# Patient Record
Sex: Male | Born: 1960 | Hispanic: Yes | Marital: Married | Smoking: Former smoker
Health system: Southern US, Community
[De-identification: ages and names within clinical notes are randomized; demographics above are authoritative.]

## PROBLEM LIST (undated history)

## (undated) DIAGNOSIS — D179 Benign lipomatous neoplasm, unspecified: Secondary | ICD-10-CM

## (undated) DIAGNOSIS — K8020 Calculus of gallbladder without cholecystitis without obstruction: Secondary | ICD-10-CM

## (undated) DIAGNOSIS — M1990 Unspecified osteoarthritis, unspecified site: Secondary | ICD-10-CM

## (undated) DIAGNOSIS — I10 Essential (primary) hypertension: Secondary | ICD-10-CM

## (undated) HISTORY — PX: OTHER SURGICAL HISTORY: SHX169

---

## 2012-01-16 HISTORY — PX: BACK SURGERY: SHX140

## 2013-01-22 DIAGNOSIS — Z23 Encounter for immunization: Secondary | ICD-10-CM | POA: Diagnosis not present

## 2013-01-22 DIAGNOSIS — Z136 Encounter for screening for cardiovascular disorders: Secondary | ICD-10-CM | POA: Diagnosis not present

## 2013-01-22 DIAGNOSIS — K2970 Gastritis, unspecified, without bleeding: Secondary | ICD-10-CM | POA: Diagnosis not present

## 2013-01-22 DIAGNOSIS — Z125 Encounter for screening for malignant neoplasm of prostate: Secondary | ICD-10-CM | POA: Diagnosis not present

## 2013-01-22 DIAGNOSIS — Z0000 Encounter for general adult medical examination without abnormal findings: Secondary | ICD-10-CM | POA: Diagnosis not present

## 2013-01-22 DIAGNOSIS — K2990 Gastroduodenitis, unspecified, without bleeding: Secondary | ICD-10-CM | POA: Diagnosis not present

## 2013-01-22 DIAGNOSIS — Z1331 Encounter for screening for depression: Secondary | ICD-10-CM | POA: Diagnosis not present

## 2013-01-22 DIAGNOSIS — M519 Unspecified thoracic, thoracolumbar and lumbosacral intervertebral disc disorder: Secondary | ICD-10-CM | POA: Diagnosis not present

## 2013-01-22 DIAGNOSIS — Z131 Encounter for screening for diabetes mellitus: Secondary | ICD-10-CM | POA: Diagnosis not present

## 2013-02-19 DIAGNOSIS — M545 Low back pain, unspecified: Secondary | ICD-10-CM | POA: Diagnosis not present

## 2013-02-19 DIAGNOSIS — M5137 Other intervertebral disc degeneration, lumbosacral region: Secondary | ICD-10-CM | POA: Diagnosis not present

## 2013-02-19 DIAGNOSIS — IMO0002 Reserved for concepts with insufficient information to code with codable children: Secondary | ICD-10-CM | POA: Diagnosis not present

## 2013-02-19 DIAGNOSIS — M47817 Spondylosis without myelopathy or radiculopathy, lumbosacral region: Secondary | ICD-10-CM | POA: Diagnosis not present

## 2013-02-20 ENCOUNTER — Other Ambulatory Visit: Payer: Self-pay | Admitting: Orthopaedic Surgery

## 2013-02-20 DIAGNOSIS — M545 Low back pain, unspecified: Secondary | ICD-10-CM

## 2013-02-26 ENCOUNTER — Ambulatory Visit
Admission: RE | Admit: 2013-02-26 | Discharge: 2013-02-26 | Disposition: A | Discharge status: Home / Self Care | Payer: Medicare Other | Source: Ambulatory Visit | Attending: Orthopaedic Surgery | Admitting: Orthopaedic Surgery

## 2013-02-26 DIAGNOSIS — M545 Low back pain, unspecified: Secondary | ICD-10-CM

## 2013-03-09 DIAGNOSIS — M545 Low back pain, unspecified: Secondary | ICD-10-CM | POA: Diagnosis not present

## 2013-03-09 DIAGNOSIS — M4120 Other idiopathic scoliosis, site unspecified: Secondary | ICD-10-CM | POA: Diagnosis not present

## 2013-03-09 DIAGNOSIS — M47817 Spondylosis without myelopathy or radiculopathy, lumbosacral region: Secondary | ICD-10-CM | POA: Diagnosis not present

## 2013-03-09 DIAGNOSIS — IMO0002 Reserved for concepts with insufficient information to code with codable children: Secondary | ICD-10-CM | POA: Diagnosis not present

## 2013-03-09 DIAGNOSIS — Z87442 Personal history of urinary calculi: Secondary | ICD-10-CM | POA: Diagnosis not present

## 2013-03-09 DIAGNOSIS — M5137 Other intervertebral disc degeneration, lumbosacral region: Secondary | ICD-10-CM | POA: Diagnosis not present

## 2013-03-09 DIAGNOSIS — M4716 Other spondylosis with myelopathy, lumbar region: Secondary | ICD-10-CM | POA: Diagnosis not present

## 2013-03-09 DIAGNOSIS — Z87891 Personal history of nicotine dependence: Secondary | ICD-10-CM | POA: Diagnosis not present

## 2013-03-16 DIAGNOSIS — M4716 Other spondylosis with myelopathy, lumbar region: Secondary | ICD-10-CM | POA: Diagnosis not present

## 2013-03-16 DIAGNOSIS — M4120 Other idiopathic scoliosis, site unspecified: Secondary | ICD-10-CM | POA: Diagnosis present

## 2013-03-16 DIAGNOSIS — Z87442 Personal history of urinary calculi: Secondary | ICD-10-CM | POA: Diagnosis not present

## 2013-03-16 DIAGNOSIS — IMO0002 Reserved for concepts with insufficient information to code with codable children: Secondary | ICD-10-CM | POA: Diagnosis not present

## 2013-03-16 DIAGNOSIS — Z87891 Personal history of nicotine dependence: Secondary | ICD-10-CM | POA: Diagnosis not present

## 2013-03-16 DIAGNOSIS — M48061 Spinal stenosis, lumbar region without neurogenic claudication: Secondary | ICD-10-CM | POA: Diagnosis not present

## 2013-03-16 DIAGNOSIS — M5137 Other intervertebral disc degeneration, lumbosacral region: Secondary | ICD-10-CM | POA: Diagnosis present

## 2013-03-16 DIAGNOSIS — Z981 Arthrodesis status: Secondary | ICD-10-CM | POA: Diagnosis not present

## 2013-04-03 DIAGNOSIS — M48061 Spinal stenosis, lumbar region without neurogenic claudication: Secondary | ICD-10-CM | POA: Diagnosis not present

## 2013-04-03 DIAGNOSIS — M545 Low back pain, unspecified: Secondary | ICD-10-CM | POA: Diagnosis not present

## 2013-04-03 DIAGNOSIS — M4120 Other idiopathic scoliosis, site unspecified: Secondary | ICD-10-CM | POA: Diagnosis not present

## 2013-04-03 DIAGNOSIS — M4716 Other spondylosis with myelopathy, lumbar region: Secondary | ICD-10-CM | POA: Diagnosis not present

## 2013-06-01 DIAGNOSIS — M79609 Pain in unspecified limb: Secondary | ICD-10-CM | POA: Diagnosis not present

## 2013-06-01 DIAGNOSIS — M545 Low back pain, unspecified: Secondary | ICD-10-CM | POA: Diagnosis not present

## 2013-06-25 DIAGNOSIS — M4716 Other spondylosis with myelopathy, lumbar region: Secondary | ICD-10-CM | POA: Diagnosis not present

## 2013-08-27 DIAGNOSIS — IMO0002 Reserved for concepts with insufficient information to code with codable children: Secondary | ICD-10-CM | POA: Diagnosis not present

## 2013-08-27 DIAGNOSIS — M5137 Other intervertebral disc degeneration, lumbosacral region: Secondary | ICD-10-CM | POA: Diagnosis not present

## 2013-08-27 DIAGNOSIS — M545 Low back pain, unspecified: Secondary | ICD-10-CM | POA: Diagnosis not present

## 2013-08-27 DIAGNOSIS — M4716 Other spondylosis with myelopathy, lumbar region: Secondary | ICD-10-CM | POA: Diagnosis not present

## 2013-09-04 DIAGNOSIS — M545 Low back pain, unspecified: Secondary | ICD-10-CM | POA: Diagnosis not present

## 2013-09-04 DIAGNOSIS — IMO0002 Reserved for concepts with insufficient information to code with codable children: Secondary | ICD-10-CM | POA: Diagnosis not present

## 2013-09-04 DIAGNOSIS — M4716 Other spondylosis with myelopathy, lumbar region: Secondary | ICD-10-CM | POA: Diagnosis not present

## 2013-09-09 DIAGNOSIS — M545 Low back pain, unspecified: Secondary | ICD-10-CM | POA: Diagnosis not present

## 2013-09-09 DIAGNOSIS — IMO0002 Reserved for concepts with insufficient information to code with codable children: Secondary | ICD-10-CM | POA: Diagnosis not present

## 2013-09-09 DIAGNOSIS — M4716 Other spondylosis with myelopathy, lumbar region: Secondary | ICD-10-CM | POA: Diagnosis not present

## 2013-10-21 DIAGNOSIS — M4806 Spinal stenosis, lumbar region: Secondary | ICD-10-CM | POA: Diagnosis not present

## 2013-10-21 DIAGNOSIS — M961 Postlaminectomy syndrome, not elsewhere classified: Secondary | ICD-10-CM | POA: Diagnosis not present

## 2013-10-21 DIAGNOSIS — M545 Low back pain: Secondary | ICD-10-CM | POA: Diagnosis not present

## 2013-10-21 DIAGNOSIS — M4716 Other spondylosis with myelopathy, lumbar region: Secondary | ICD-10-CM | POA: Diagnosis not present

## 2013-10-26 ENCOUNTER — Other Ambulatory Visit: Payer: Self-pay | Admitting: Orthopaedic Surgery

## 2013-10-26 DIAGNOSIS — M545 Low back pain: Secondary | ICD-10-CM

## 2013-11-03 ENCOUNTER — Ambulatory Visit
Admission: RE | Admit: 2013-11-03 | Discharge: 2013-11-03 | Disposition: A | Discharge status: Home / Self Care | Payer: Medicare Other | Source: Ambulatory Visit | Attending: Orthopaedic Surgery | Admitting: Orthopaedic Surgery

## 2013-11-03 DIAGNOSIS — M5136 Other intervertebral disc degeneration, lumbar region: Secondary | ICD-10-CM | POA: Diagnosis not present

## 2013-11-03 DIAGNOSIS — M545 Low back pain: Secondary | ICD-10-CM

## 2013-11-03 DIAGNOSIS — M4806 Spinal stenosis, lumbar region: Secondary | ICD-10-CM | POA: Diagnosis not present

## 2013-11-03 DIAGNOSIS — M47816 Spondylosis without myelopathy or radiculopathy, lumbar region: Secondary | ICD-10-CM | POA: Diagnosis not present

## 2013-11-03 MED ORDER — GADOBENATE DIMEGLUMINE 529 MG/ML IV SOLN
20.0000 mL | Freq: Once | INTRAVENOUS | Status: AC | PRN
  Administered 2013-11-03: 20 mL via INTRAVENOUS

## 2013-12-02 DIAGNOSIS — M961 Postlaminectomy syndrome, not elsewhere classified: Secondary | ICD-10-CM | POA: Diagnosis not present

## 2013-12-02 DIAGNOSIS — M545 Low back pain: Secondary | ICD-10-CM | POA: Diagnosis not present

## 2013-12-02 DIAGNOSIS — M4726 Other spondylosis with radiculopathy, lumbar region: Secondary | ICD-10-CM | POA: Diagnosis not present

## 2013-12-21 DIAGNOSIS — M4726 Other spondylosis with radiculopathy, lumbar region: Secondary | ICD-10-CM | POA: Diagnosis not present

## 2013-12-21 DIAGNOSIS — M545 Low back pain: Secondary | ICD-10-CM | POA: Diagnosis not present

## 2013-12-21 DIAGNOSIS — M4716 Other spondylosis with myelopathy, lumbar region: Secondary | ICD-10-CM | POA: Diagnosis not present

## 2013-12-21 DIAGNOSIS — M961 Postlaminectomy syndrome, not elsewhere classified: Secondary | ICD-10-CM | POA: Diagnosis not present

## 2014-01-04 DIAGNOSIS — M4726 Other spondylosis with radiculopathy, lumbar region: Secondary | ICD-10-CM | POA: Diagnosis not present

## 2014-01-04 DIAGNOSIS — M961 Postlaminectomy syndrome, not elsewhere classified: Secondary | ICD-10-CM | POA: Diagnosis not present

## 2014-01-04 DIAGNOSIS — M545 Low back pain: Secondary | ICD-10-CM | POA: Diagnosis not present

## 2014-03-15 DIAGNOSIS — K219 Gastro-esophageal reflux disease without esophagitis: Secondary | ICD-10-CM | POA: Diagnosis not present

## 2014-04-12 DIAGNOSIS — Z87442 Personal history of urinary calculi: Secondary | ICD-10-CM | POA: Diagnosis not present

## 2014-04-12 DIAGNOSIS — Z1389 Encounter for screening for other disorder: Secondary | ICD-10-CM | POA: Diagnosis not present

## 2014-04-12 DIAGNOSIS — E780 Pure hypercholesterolemia: Secondary | ICD-10-CM | POA: Diagnosis not present

## 2014-04-12 DIAGNOSIS — Z0000 Encounter for general adult medical examination without abnormal findings: Secondary | ICD-10-CM | POA: Diagnosis not present

## 2014-04-12 DIAGNOSIS — Z125 Encounter for screening for malignant neoplasm of prostate: Secondary | ICD-10-CM | POA: Diagnosis not present

## 2014-04-19 ENCOUNTER — Encounter (HOSPITAL_COMMUNITY): Payer: Self-pay | Admitting: Emergency Medicine

## 2014-04-19 ENCOUNTER — Emergency Department (HOSPITAL_COMMUNITY)
Admission: EM | Admit: 2014-04-19 | Discharge: 2014-04-20 | Disposition: A | Discharge status: Home / Self Care | Payer: Medicare Other | Attending: Emergency Medicine | Admitting: Emergency Medicine

## 2014-04-19 DIAGNOSIS — D1739 Benign lipomatous neoplasm of skin and subcutaneous tissue of other sites: Secondary | ICD-10-CM | POA: Diagnosis not present

## 2014-04-19 DIAGNOSIS — R2242 Localized swelling, mass and lump, left lower limb: Secondary | ICD-10-CM | POA: Insufficient documentation

## 2014-04-19 DIAGNOSIS — Z87891 Personal history of nicotine dependence: Secondary | ICD-10-CM | POA: Diagnosis not present

## 2014-04-19 DIAGNOSIS — Z79899 Other long term (current) drug therapy: Secondary | ICD-10-CM | POA: Diagnosis not present

## 2014-04-19 DIAGNOSIS — R1031 Right lower quadrant pain: Secondary | ICD-10-CM | POA: Diagnosis present

## 2014-04-19 DIAGNOSIS — N201 Calculus of ureter: Secondary | ICD-10-CM | POA: Diagnosis not present

## 2014-04-19 LAB — I-STAT CHEM 8, ED
BUN: 16 mg/dL (ref 6–23)
CREATININE: 1.1 mg/dL (ref 0.50–1.35)
Calcium, Ion: 1.31 mmol/L — ABNORMAL HIGH (ref 1.12–1.23)
Chloride: 103 mmol/L (ref 96–112)
Glucose, Bld: 163 mg/dL — ABNORMAL HIGH (ref 70–99)
HEMATOCRIT: 48 % (ref 39.0–52.0)
HEMOGLOBIN: 16.3 g/dL (ref 13.0–17.0)
POTASSIUM: 3.5 mmol/L (ref 3.5–5.1)
Sodium: 139 mmol/L (ref 135–145)
TCO2: 21 mmol/L (ref 0–100)

## 2014-04-19 LAB — CBC WITH DIFFERENTIAL/PLATELET
BASOS ABS: 0.1 10*3/uL (ref 0.0–0.1)
BASOS PCT: 1 % (ref 0–1)
EOS ABS: 0 10*3/uL (ref 0.0–0.7)
Eosinophils Relative: 0 % (ref 0–5)
HCT: 44 % (ref 39.0–52.0)
Hemoglobin: 15.1 g/dL (ref 13.0–17.0)
Lymphocytes Relative: 6 % — ABNORMAL LOW (ref 12–46)
Lymphs Abs: 0.6 10*3/uL — ABNORMAL LOW (ref 0.7–4.0)
MCH: 30.9 pg (ref 26.0–34.0)
MCHC: 34.3 g/dL (ref 30.0–36.0)
MCV: 90.2 fL (ref 78.0–100.0)
Monocytes Absolute: 0.6 10*3/uL (ref 0.1–1.0)
Monocytes Relative: 6 % (ref 3–12)
NEUTROS PCT: 87 % — AB (ref 43–77)
Neutro Abs: 8.1 10*3/uL — ABNORMAL HIGH (ref 1.7–7.7)
PLATELETS: 333 10*3/uL (ref 150–400)
RBC: 4.88 MIL/uL (ref 4.22–5.81)
RDW: 12.5 % (ref 11.5–15.5)
WBC: 9.3 10*3/uL (ref 4.0–10.5)

## 2014-04-19 LAB — URINALYSIS, ROUTINE W REFLEX MICROSCOPIC
Bilirubin Urine: NEGATIVE
Glucose, UA: NEGATIVE mg/dL
KETONES UR: 15 mg/dL — AB
LEUKOCYTES UA: NEGATIVE
NITRITE: NEGATIVE
PROTEIN: NEGATIVE mg/dL
Specific Gravity, Urine: 1.018 (ref 1.005–1.030)
Urobilinogen, UA: 0.2 mg/dL (ref 0.0–1.0)
pH: 6 (ref 5.0–8.0)

## 2014-04-19 LAB — URINE MICROSCOPIC-ADD ON

## 2014-04-19 MED ORDER — SODIUM CHLORIDE 0.9 % IV BOLUS (SEPSIS)
1000.0000 mL | Freq: Once | INTRAVENOUS | Status: AC
  Administered 2014-04-19: 1000 mL via INTRAVENOUS

## 2014-04-19 MED ORDER — KETOROLAC TROMETHAMINE 15 MG/ML IJ SOLN
15.0000 mg | Freq: Once | INTRAMUSCULAR | Status: AC
  Administered 2014-04-19: 15 mg via INTRAVENOUS
  Filled 2014-04-19: qty 1

## 2014-04-19 MED ORDER — ONDANSETRON HCL 4 MG/2ML IJ SOLN
4.0000 mg | Freq: Once | INTRAMUSCULAR | Status: AC
  Administered 2014-04-19: 4 mg via INTRAVENOUS
  Filled 2014-04-19: qty 2

## 2014-04-19 NOTE — ED Provider Notes (Signed)
CSN: [id number]     Arrival date & time 04/19/14  1954 History   First MD Initiated Contact with Patient 04/19/14 2231     Chief Complaint  Patient presents with  . Flank Pain     Patient is a 54 y.o. male presenting with flank pain. The history is provided by the patient. No language interpreter was used.  Flank Pain   Mr. Sheek here for evaluation of flank pain. He reports that he's had right lower quadrant pain radiating to the back and her medically for the last 3 weeks. He saw his PCP last week and had a urine test for blood in his urine. His symptoms became worse today and now they are constant. He reports hematuria today with nausea and vomiting. The vomiting is secondary to pain. He denies any fevers, cough, shortness of breath.  He has a history of similar symptoms several years ago and was diagnosed with kidney stones. Symptoms are moderate, constant, worsening.  History reviewed. No pertinent past medical history. Past Surgical History  Procedure Laterality Date  . Back surgery     No family history on file. History  Substance Use Topics  . Smoking status: Former Research scientist (life sciences)  . Smokeless tobacco: Not on file  . Alcohol Use: Yes    Review of Systems  Genitourinary: Positive for flank pain.  All other systems reviewed and are negative.     Allergies  Review of patient's allergies indicates not on file.  Home Medications   Prior to Admission medications   Medication Sig Start Date End Date Taking? Authorizing Provider  gabapentin (NEURONTIN) 300 MG capsule Take 300 mg by mouth 3 (three) times daily.   Yes Historical Provider, MD   BP 156/93 mmHg  Pulse 66  Temp(Src) 99 F (37.2 C) (Oral)  Resp 15  Ht 6\' 1"  (1.854 m)  Wt 217 lb (98.431 kg)  BMI 28.64 kg/m2  SpO2 97% Physical Exam  Constitutional: He is oriented to person, place, and time. He appears well-developed and well-nourished.  HENT:  Head: Normocephalic and atraumatic.  Cardiovascular: Normal rate  and regular rhythm.   No murmur heard. Pulmonary/Chest: Effort normal and breath sounds normal. No respiratory distress.  Abdominal: Soft. There is no rebound and no guarding.  Mild right lower quadrant tenderness without any guarding or rebound  Musculoskeletal: He exhibits no edema or tenderness.  Neurological: He is alert and oriented to person, place, and time.  Skin: Skin is warm and dry.  Psychiatric: He has a normal mood and affect. His behavior is normal.  Nursing note and vitals reviewed.   ED Course  Procedures (including critical care time) Labs Review Labs Reviewed  URINALYSIS, ROUTINE W REFLEX MICROSCOPIC - Abnormal; Notable for the following:    Hgb urine dipstick MODERATE (*)    Ketones, ur 15 (*)    All other components within normal limits  CBC WITH DIFFERENTIAL/PLATELET - Abnormal; Notable for the following:    Neutrophils Relative % 87 (*)    Neutro Abs 8.1 (*)    Lymphocytes Relative 6 (*)    Lymphs Abs 0.6 (*)    All other components within normal limits  I-STAT CHEM 8, ED - Abnormal; Notable for the following:    Glucose, Bld 163 (*)    Calcium, Ion 1.31 (*)    All other components within normal limits  URINE MICROSCOPIC-ADD ON    Imaging Review No results found.   EKG Interpretation None      MDM  Final diagnoses:  Ureterolithiasis  Mass of left hip region    Patient here for evaluation of right-sided flank pain that is intermittent in nature with hematuria. History and presentation concerning for renal colic. CT renal study pending. Patient care transferred to Dr. Roxanne Mins pending CT abdomen. Patient's pain resolved after Toradol administration.    Quintella Reichert, MD 04/21/14 [phone]

## 2014-04-19 NOTE — ED Notes (Signed)
Pt c/o right lower abd pain and right flank pain, onset this am.  Also c/o hematuria and nausea.

## 2014-04-19 NOTE — ED Notes (Signed)
Called CT for eta, patient is 6th in line. Informed patient delay.

## 2014-04-20 ENCOUNTER — Emergency Department (HOSPITAL_COMMUNITY): Payer: Medicare Other

## 2014-04-20 ENCOUNTER — Encounter (HOSPITAL_COMMUNITY): Payer: Self-pay

## 2014-04-20 DIAGNOSIS — N201 Calculus of ureter: Secondary | ICD-10-CM | POA: Diagnosis not present

## 2014-04-20 DIAGNOSIS — D1739 Benign lipomatous neoplasm of skin and subcutaneous tissue of other sites: Secondary | ICD-10-CM | POA: Diagnosis not present

## 2014-04-20 MED ORDER — TAMSULOSIN HCL 0.4 MG PO CAPS: 0.4000 mg | ORAL_CAPSULE | Freq: Every day | ORAL | Status: DC

## 2014-04-20 MED ORDER — OXYCODONE-ACETAMINOPHEN 5-325 MG PO TABS: 1.0000 | ORAL_TABLET | ORAL | Status: DC | PRN

## 2014-04-20 NOTE — Discharge Instructions (Signed)
Take ibuprofen or naproxen as needed.  You have a mass near your left hip - see the orthopedic doctor to evaluate it.  Clculos renales (Kidney Stones) Los clculos renales (urolitiasis) son masas slidas que se forman en el interior de los riones. El dolor intenso es causado por el movimiento de la piedra a travs del tracto urinario. Cuando la piedra se mueve, el urter hace un espasmo alrededor de la misma. El clculo generalmente se elimina con la [place].  CAUSAS   Un trastorno que hace que ciertas glndulas del cuello produzcan demasiada hormona paratiroidea (hiperparatiroidismo primario).  Una acumulacin de cristales de cido rico, similar a Psychiatric nurse.  Estrechamiento (constriccin) del urter.  Obstruccin en el rin presente al nacer (obstruccin congnita).  Cirugas previas del rin o los urteres.  Numerosas infecciones renales. SNTOMAS   Ganas de vomitar (nuseas).  Devolver la comida (vomitar).  Sangre en la orina (hematuria).  Dolor que generalmente se expande (irradia) hacia la ingle.  Ganas de orinar con frecuencia o de manera urgente. DIAGNSTICO   Historia clnica y examen fsico.  Anlisis de sangre y [place].  Tomografa computada.  En algunos casos se realiza un examen del interior de la vejiga (citoscopa). TRATAMIENTO   Observacin.  Aumentar la ingesta de lquidos.  Litotricia extracorprea con ondas de choque: es un procedimiento no invasivo que [place] ondas de choque para romper los clculos renales.  Ser necesaria la ciruga si tiene dolor muy intenso o la obstruccin persiste. Hay varios procedimientos quirrgicos. La mayora de los procedimientos se realizan con el uso de pequeos instrumentos. Slo es Chartered loss adjuster pequeas incisiones para acomodar estos instrumentos, por lo tanto el tiempo de recuperacin es mnimo. El tamao, la ubicacin y la composicin qumica de los clculos son variables importantes  que determinarn la eleccin correcta de tratamiento para su caso. Comunquese con su mdico para comprender mejor su situacin, de modo que pueda [employer] de lesiones para usted y su rin.  INSTRUCCIONES PARA EL CUIDADO EN EL HOGAR   Beba gran cantidad de lquido para mantener la orina de tono claro o color amarillo plido. Esto ayudar a eliminar las piedras o los fragmentos.  Cuele la orina con el colador que le han provisto. Guarde todas las partculas y piedras para que las vea el profesional que lo asiste. Puede ser tan pequea como un grano de sal. Es muy importante usar el colador cada vez que orine. La recoleccin de piedras permitir al Medtronic y Musician que efectivamente ha eliminado una piedra. El anlisis de la piedra con frecuencia permitir identificar qu puede hacer para reducir la incidencia de las recurrencias.  Slo tome medicamentos de venta libre o recetados para Glass blower/designer, Health and safety inspector o bajar la fiebre, segn las indicaciones de su mdico.  Cumpla con las citas de seguimiento tal como le indic el profesional que lo asiste.  Si se lo indica, hgase radiografas. La ausencia de dolor no siempre significa que las piedras se han eliminado. Puede ser que simplemente hayan dejado de moverse. Si el paso de orina permanece completamente obstruido, puede causar prdida de la funcin renal o simplemente la destruccin del rin. Es su responsabilidad Artist seguimiento y las radiografas. Las ecografas del rin pueden mostrar una obstruccin y el estado del rin. Las ecografas no se asocian con la radiacin y pueden realizarse fcilmente en cuestin de minutos. SOLICITE ATENCIN MDICA SI:  Siente dolor que no responde a los [employer]  recetaron. SOLICITE ATENCIN MDICA DE INMEDIATO SI:   No puede controlar el dolor con los medicamentos que le han recetado.  Siente escalofros o fiebre.  La gravedad o la intensidad del dolor aumenta  durante 18 horas y no se Engineer, production con los analgsicos.  Presenta un nuevo episodio de dolor abdominal.  Sufre mareos o se desmaya.  No puede orinar. ASEGRESE DE QUE:   Comprende estas instrucciones.  Controlar su afeccin.  Recibir ayuda de inmediato si no mejora o si empeora. Document Released: 01/01/2005 Document Revised: 09/03/2012 [employer] Patient Information 2015 [place]. This information is not intended to replace advice given to you by your health care provider. Make sure you discuss any questions you have with your health care provider.  Acetaminophen; Oxycodone tablets Qu es este medicamento? El compuesto ACETAMINOFENO; OXICODONA es un analgsico. Se utiliza para tratar los dolores leves a moderados. Este medicamento puede ser utilizado para otros usos; si tiene alguna pregunta consulte con su proveedor de atencin mdica o con su farmacutico. MARCAS COMERCIALES DISPONIBLES: Endocet, Magnacet, Narvox, Percocet, Perloxx, Primalev, Primlev, Roxicet, Xolox [employer] debo informar a mi profesional de la salud antes de tomar este medicamento? Necesita saber si usted presenta alguno de los [employer] o situaciones: -tumor cerebral -enfermedad de Crohn, enfermedad intestinal inflamatoria o colitis ulcerativa -abuso de drogas o drogadiccin -lesin de la cabeza -problemas cardiacos o circulatorios -si consume alcohol con frecuencia -enfermedad renal o problemas al orinar -enfermedad heptica -enfermedad pulmonar, asma o dificultades al respirar -una reaccin alrgica o inusual al acetaminofeno, a la oxicodona, a otros analgsicos opiceos, a otros medicamentos, alimentos, colorantes o conservantes -si est embarazada o buscando quedar embarazada -si est amamantando a un beb Cmo debo utilizar este medicamento? Tome este medicamento por va oral con un vaso lleno de agua. Siga las instrucciones de la etiqueta del [place]. Tome sus dosis a intervalos  regulares. No tome su medicamento con una frecuencia mayor que la indicada. Hable con su pediatra para informarse acerca del uso de este medicamento en nios. Puede requerir Sales executive. Los pacientes de ms de 65 aos de edad pueden presentar reacciones ms fuertes a Fish farm manager y Designer, industrial/product dosis menores. Sobredosis: Pngase en contacto inmediatamente con un centro toxicolgico o una sala de urgencia si usted cree que haya tomado demasiado medicamento. ATENCIN: [employer] es solo para usted. No comparta este medicamento con nadie. Qu sucede si me olvido de una dosis? Si olvida una dosis, tmela lo antes posible. Si es casi la hora de la prxima dosis, tome slo esa dosis. No tome dosis adicionales o dobles. Qu puede interactuar con este medicamento? -alcohol -antihistamnicos -barbitricos tales como el amobarbital, butalbital, butabarbital, metohexital, pentobarbital, fenobarbital, tiopental y secobarbital -benztropina -medicamentos para problemas de vejiga, tales como solifenacina, trospium, oxibutinina, tolterodina, hiosciamina y metscopolamina -medicamentos para problemas respiratorios, tales como ipratropio y tiotropio -medicamentos para ciertos problemas estomacales o intestinales, tales como propantelina, homatropina metilbromuro, Sports administrator, atropina, belladona y diciclomina -anestsicos generales, tales como etomidato, [place], xido nitroso, propofol, desflurano, enflurano, halotano, isoflurano y sevoflurano -medicamentos para la depresin, ansiedad o trastornos psicticos -medicamentos para dormir -relajantes musculares -naltrexona -medicamentos narcticos (opiceos) para Conservation officer, historic buildings -fenotiazinas, tales como perfenacina, tioridazina, clorpromacina, mesoridazina, flufenazina, proclorperazina, promazina y trifluoperazina -escopolamina -tramadol -trihexifenidilo Puede ser que esta lista no menciona todas las posibles interacciones. Informe a su profesional  de [employer]	[place] de [employer] productos a base de hierbas, medicamentos de [place] o suplementos nutritivos que est tomando. Si usted fuma, consume bebidas alcohlicas o  si utiliza drogas ilegales, indqueselo tambin a su profesional de [employer]	[place]. Algunas sustancias pueden interactuar con su medicamento. A qu debo estar atento al usar Coca-Cola? Si el dolor no desaparece, si empeora o si experimenta un dolor nuevo o de tipo diferente, consulte a su mdico o a su profesional de [employer]	[place]. Usted puede desarrollar tolerancia al medicamento. La tolerancia significa que necesitar una dosis ms alta para Best boy. Tolerancia es normal y esperada cuando est tomando este medicamento por un largo perodo de [place]. No suspenda el uso de su medicamento repentinamente debido a que puede Engineer, materials reaccin severa. Su cuerpo se acostumbra a Fish farm manager. Esto NO significa que sea adicto. La adiccin es un comportamiento que hace referencia a la obtencin y utilizacin de un medicamento con fines que no son mdicos. Si tiene Social research officer, government, existe una razn mdica para que usted tome un analgsico. Su mdico le indicar la cantidad de medicamento que Tree surgeon. Si su mdico desea que [employer], la dosis ser reducida gradualmente para Research officer, political party secundarios. Puede experimentar somnolencia o mareos. No conduzca ni utilice maquinaria ni haga nada que Associate Professor en estado de alerta hasta que sepa cmo le afecta este medicamento. No se siente ni se ponga de pie con rapidez, especialmente si es un paciente de edad avanzada. Esto reduce el riesgo de mareos o [employer]. El alcohol puede interferir con el efecto de este medicamento. Evite consumir bebidas alcohlicas. Hay distintos tipos de medicamentos narcticos (opiceos) para Conservation officer, historic buildings. Si usted toma ms que un tipo a la [employer], podr tener ms [employer]. Dar a su proveedor de atencin medica una lista de todos los  medicamentos que usted [place]. Su mdico le informar la cantidad de medicamento que Aeronautical engineer. No tome ms medicamento que lo indicado. Comunquese con emergencia para ayuda si tiene problemas para respirar. Este medicamento causar estreimiento. Trate de evacuar los intestinos al menos cada 2  3 das. Si no evacua los intestinos durante 3 das, comunquese con su mdico o con su profesional de [employer]	[place]. No tome Tylenol (acetaminofeno) u otros medicamentos que contienen acetaminofeno con este medicamento. Tomando mucho acetaminofeno puede ser muy peligroso. Muchos medicamentos de venta libre contienen acetaminofeno. Lea siempre las etiquetas cuidadosamente para evitar el tomar ms acetaminofeno. Qu efectos secundarios puedo tener al [employer] este medicamento? Efectos secundarios que debe informar a su mdico o a Barrister's clerk de la salud tan pronto como sea posible: -Scientist, clinical (histocompatibility and immunogenetics), tales como erupcin cutnea, picazn o urticarias, hinchazn de la cara, labios o lengua -dificultades respiratorias, sibilancias -confusin -sensacin de desmayos o aturdimiento -dolor de estmago severo -cansancio o debilidad inusual -color amarillento de los ojos o la piel Efectos secundarios que, por lo general, no requieren atencin mdica (debe informarlos a su mdico o a su profesional de la salud si persisten o si son molestos): -mareos -somnolencia -nuseas -vmitos Puede ser que esta lista no menciona todos los posibles efectos secundarios. Comunquese a su mdico por asesoramiento mdico [employer]. Usted puede informar los efectos secundarios a la FDA por telfono al 1-800-FDA-1088. Dnde debo guardar mi medicina? Mantngala fuera del alcance de los nios. Este medicamento puede ser abusado. Mantenga su medicamento en un lugar seguro para protegerlo contra robos. No comparta este medicamento con nadie. Es peligroso vender o ceder este medicamento y est prohibido por la  ley. Gurdelo a [employer], entre 20 y 64 grados C (41 y 65 grados F). Mantenga el envase  bien cerrado. Protjalo de Naval architect. Este medicamento puede causar muerte y sobredosis accidental si es tomado por otros adultos, nios o Copy. Tire los medicamentos que no haya utilizado al inodoro para reducir la posibilidad de dao. No use el medicamento despus de la fecha de vencimiento. ATENCIN: Este folleto es un resumen. Puede ser que no cubra toda la posible informacin. Si usted tiene preguntas acerca de esta medicina, consulte con su mdico, su farmacutico o su profesional de Technical sales engineer.  2015, Elsevier/Gold Standard. (2012-09-15 19:44:45)  Tamsulosin capsules Qu es este medicamento? La TAMSULOSINA se [place] para tratar el agrandamiento de la glndula prosttica en los hombres llamado hiperplasia prosttica benigna o HPB. No se debe utilizar para tratar mujeres. Este medicamento acta como relajante de los msculos de la prstata y del cuello vesical. Esto mejora el paso de la orina y reduce los sntomas de HPB. Este medicamento puede ser utilizado para otros usos; si tiene alguna pregunta consulte con su proveedor de atencin mdica o con su farmacutico. MARCAS COMERCIALES DISPONIBLES: Flomax Qu le debo informar a mi profesional de la salud antes de tomar este medicamento? Necesita saber si usted presenta alguno de los siguientes problemas o situaciones: -enfermedad renal avanzada -enfermedad heptica avanzada -baja presin sangunea -cncer de prstata -una reaccin alrgica o inusual a la tamsulosina, sulfonamidas, a otros medicamentos, alimentos, colorantes o conservantes -si est embarazada o buscando quedar embarazada -si est amamantando a un beb Cmo debo utilizar este medicamento? Tome este medicamento por va oral aproximadamente 30 minutos despus de la [employer]. Siga las instrucciones de la etiqueta del [place]. Tome las cpsulas enteras con un  vaso de agua. No triture, mastique ni abre las cpsulas. No tome su medicamento con una frecuencia mayor a la indicada. No deje de tomarlo excepto si as lo indica su mdico. Hable con su pediatra para informarse acerca del uso de este medicamento en nios. Puede requerir atencin especial. Sobredosis: Pngase en contacto inmediatamente con un centro toxicolgico o una sala de urgencia si usted cree que haya tomado demasiado medicamento. ATENCIN: [employer] es solo para usted. No comparta este medicamento con nadie. Qu sucede si me olvido de una dosis? Si olvida una dosis, tmela tan pronto como sea posible. Si es casi la hora de la prxima dosis, tome slo esa dosis. No tome dosis adicionales o dobles. Si deja de tomar su [place] o ms, pregunte a su mdico o su profesional de la salud la dosis que debe volver a Medical laboratory scientific officer a Radio producer. Qu puede interactuar con este medicamento? -cimetidina -fluoxetina -quetoconazol -medicamentos para disfuncin erctil como, sildenafil, tadalafil, vardenafil -medicamentos para la alta presin sangunea -otros alfa-bloqueadores como, alfuzosn, doxazosn, fentolamina, fenoxibenzamina, prazosn, terazosn -warfarina Puede ser que esta lista no menciona todas las posibles interacciones. Informe a su profesional de [employer]	[place] de [employer] productos a base de hierbas, medicamentos de [place] o suplementos nutritivos que est tomando. Si usted fuma, consume bebidas alcohlicas o si utiliza drogas ilegales, indqueselo tambin a su profesional de [employer]	[place]. Algunas sustancias pueden interactuar con su medicamento. A qu debo estar atento al usar Coca-Cola? Visite a su mdico o a su profesional de la salud para chequear su evolucin peridicamente. Necesitar realizarse [employer] de laboratorio antes de Geophysicist/field seismologist tratamiento con este medicamento y regularmente mientras toma este medicamento. Controle su presin sangunea como le haya  indicado. Pregunte a su profesional de la salud cules deben ser su presin sangunea y cundo deber  comunicarse con l o ella. Este medicamento puede hacerle sentir mareado o aturdido. Es muy probable que experimente esto despus de la primera dosis, despus de un incremento de la dosis o cuando haga calor o realice ejercicios fsicos. El tomar alcohol o algunos medicamentos pueden aumentar este efecto. No conduzca ni utilice maquinaria, ni haga nada que Associate Professor en estado de alerta hasta que sepa cmo le afecta este medicamento. No se siente ni se ponga de pie con rapidez, especialmente si es un paciente de edad avanzada. Si comienza a sentirse mareado, sintese hasta que se sienta mejor. Estos efectos secundarios pueden disminuir una vez que su cuerpo se acostumbre a Fish farm manager. Aunque es muy raro que ocurra en hombres que toman este medicamento, informe inmediatamente a su mdico si experimenta una ereccin dolorosa o prolongada que no est relacionada con actividad sexual. Si no recibe atencin mdica inmediata, est condicin puede conducir a Systems developer. Si va a someterse a una ciruga de cataratas, informe a su cirujano oftalmlogo que est tomando Coca-Cola. Qu efectos secundarios puedo tener al [employer] este medicamento? Efectos secundarios que debe informar a su mdico o a Barrister's clerk de la salud tan pronto como sea posible: -Chief of Staff como erupcin cutnea, picazn o urticarias, hinchazn de los labios, boca, lengua o garganta -problemas respiratorios -cambios en la visin -sensacin de desmayos o aturdimiento -pulso cardiaco irregular -debilidad Efectos secundarios que, por lo general, no requieren atencin mdica (debe informarlos a su mdico o a su profesional de la salud si persisten o si son molestos): -dolor de espalda -cambios en el deseo sexual o capacidad -estreimiento, nusea o vmito -tos -somnolencia -goteo de  la Lawyer o nariz tapada -dificultad para conciliar el sueo Puede ser que esta lista no menciona todos los posibles efectos secundarios. Comunquese a su mdico por asesoramiento mdico [employer]. Usted puede informar los efectos secundarios a la FDA por telfono al 1-800-FDA-1088. Dnde debo guardar mi medicina? Mantngala fuera del alcance de los nios. Gurdela a [employer], entre 15 y 60 grados C (22 y 33 grados F). Deseche los medicamentos que no haya utilizado, despus de la fecha de vencimiento. ATENCIN: Este folleto es un resumen. Puede ser que no cubra toda la posible informacin. Si usted tiene preguntas acerca de esta medicina, consulte con su mdico, su farmacutico o su profesional de Technical sales engineer.  2015, Elsevier/Gold Standard. (2005-11-27 13:43:00)

## 2014-04-20 NOTE — ED Provider Notes (Signed)
Patient initially seen and evaluated by Dr. Ralene Bathe with right flank pain suspicious for kidney stone. CT confirms 2 mm distal right ureteral calculus. Incidental finding of mass near the left hip which may be an atypical lipoma. Currently, patient has no left hip symptoms. He is discharged with prescriptions for tamsulosin and oxycodone-acetaminophen. He is referred to with PDX regarding the master the hip, and urology.  Results for orders placed or performed during the hospital encounter of 04/19/14  Urinalysis, Routine w reflex microscopic  Result Value Ref Range   Color, Urine YELLOW YELLOW   APPearance CLEAR CLEAR   Specific Gravity, Urine 1.018 1.005 - 1.030   pH 6.0 5.0 - 8.0   Glucose, UA NEGATIVE NEGATIVE mg/dL   Hgb urine dipstick MODERATE (A) NEGATIVE   Bilirubin Urine NEGATIVE NEGATIVE   Ketones, ur 15 (A) NEGATIVE mg/dL   Protein, ur NEGATIVE NEGATIVE mg/dL   Urobilinogen, UA 0.2 0.0 - 1.0 mg/dL   Nitrite NEGATIVE NEGATIVE   Leukocytes, UA NEGATIVE NEGATIVE  CBC with Differential  Result Value Ref Range   WBC 9.3 4.0 - 10.5 K/uL   RBC 4.88 4.22 - 5.81 MIL/uL   Hemoglobin 15.1 13.0 - 17.0 g/dL   HCT 44.0 39.0 - 52.0 %   MCV 90.2 78.0 - 100.0 fL   MCH 30.9 26.0 - 34.0 pg   MCHC 34.3 30.0 - 36.0 g/dL   RDW 12.5 11.5 - 15.5 %   Platelets 333 150 - 400 K/uL   Neutrophils Relative % 87 (H) 43 - 77 %   Neutro Abs 8.1 (H) 1.7 - 7.7 K/uL   Lymphocytes Relative 6 (L) 12 - 46 %   Lymphs Abs 0.6 (L) 0.7 - 4.0 K/uL   Monocytes Relative 6 3 - 12 %   Monocytes Absolute 0.6 0.1 - 1.0 K/uL   Eosinophils Relative 0 0 - 5 %   Eosinophils Absolute 0.0 0.0 - 0.7 K/uL   Basophils Relative 1 0 - 1 %   Basophils Absolute 0.1 0.0 - 0.1 K/uL  Urine microscopic-add on  Result Value Ref Range   Squamous Epithelial / LPF RARE RARE   WBC, UA 0-2 <3 WBC/hpf   RBC / HPF 7-10 <3 RBC/hpf  I-Stat Chem 8, ED  Result Value Ref Range   Sodium 139 135 - 145 mmol/L   Potassium 3.5 3.5 - 5.1 mmol/L   Chloride 103 96 - 112 mmol/L   BUN 16 6 - 23 mg/dL   Creatinine, Ser 1.10 0.50 - 1.35 mg/dL   Glucose, Bld 163 (H) 70 - 99 mg/dL   Calcium, Ion 1.31 (H) 1.12 - 1.23 mmol/L   TCO2 21 0 - 100 mmol/L   Hemoglobin 16.3 13.0 - 17.0 g/dL   HCT 48.0 39.0 - 52.0 %   Ct Renal Stone Study  04/20/2014   CLINICAL DATA:  Right groin pain starting Monday morning. Gross hematuria.  EXAM: CT ABDOMEN AND PELVIS WITHOUT CONTRAST  TECHNIQUE: Multidetector CT imaging of the abdomen and pelvis was performed following the standard protocol without IV contrast.  COMPARISON:  None.  FINDINGS: BODY WALL: No contributory findings.  LOWER CHEST: No contributory findings.  ABDOMEN/PELVIS:  Liver: No focal abnormality.  Biliary: No evidence of biliary obstruction or stone.  Pancreas: Unremarkable.  Spleen: Unremarkable.  Adrenals: Unremarkable.  Kidneys and ureters: 2 mm stone at the right ureteral vesicular junction with moderate hydroureteronephrosis and asymmetric perinephric edema. No left hydronephrosis or urolithiasis.  Bladder: Unremarkable.  Reproductive: No pathologic findings.  Bowel: No obstruction.  Retroperitoneum: No mass or adenopathy.  Peritoneum: No ascites or pneumoperitoneum.  Vascular: No acute abnormality.  OSSEOUS: 6 x 4 x 3 cm fatty lobulated mass between the left upper rectus femoris and vastus with internal septation and minimal calcification. There is a simple appearing 18 mm lipoma within the right gluteus maximus.  IMPRESSION: 1. Obstructing 2 mm stone at the right UVJ. 2. 6 cm deep fatty mass anterior to the left hip, as above. 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 Orthopedic referral is recommended. 3. 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   Electronically Signed   By: Monte Fantasia M.D.   On: 04/20/2014 01:35   Images viewed by me.   Delora Fuel, MD 91/[id number]

## 2014-04-27 DIAGNOSIS — M25552 Pain in left hip: Secondary | ICD-10-CM | POA: Diagnosis not present

## 2014-04-28 ENCOUNTER — Other Ambulatory Visit: Payer: Self-pay | Admitting: Physician Assistant

## 2014-04-28 DIAGNOSIS — M25552 Pain in left hip: Secondary | ICD-10-CM

## 2014-05-04 DIAGNOSIS — Z6828 Body mass index (BMI) 28.0-28.9, adult: Secondary | ICD-10-CM | POA: Diagnosis not present

## 2014-05-04 DIAGNOSIS — M961 Postlaminectomy syndrome, not elsewhere classified: Secondary | ICD-10-CM | POA: Diagnosis not present

## 2014-05-04 DIAGNOSIS — M545 Low back pain: Secondary | ICD-10-CM | POA: Diagnosis not present

## 2014-05-04 DIAGNOSIS — M4726 Other spondylosis with radiculopathy, lumbar region: Secondary | ICD-10-CM | POA: Diagnosis not present

## 2014-05-12 ENCOUNTER — Ambulatory Visit
Admission: RE | Admit: 2014-05-12 | Discharge: 2014-05-12 | Disposition: A | Discharge status: Home / Self Care | Payer: Medicare Other | Source: Ambulatory Visit | Attending: Physician Assistant | Admitting: Physician Assistant

## 2014-05-12 DIAGNOSIS — M7601 Gluteal tendinitis, right hip: Secondary | ICD-10-CM | POA: Diagnosis not present

## 2014-05-12 DIAGNOSIS — M25552 Pain in left hip: Secondary | ICD-10-CM

## 2014-05-12 DIAGNOSIS — D1724 Benign lipomatous neoplasm of skin and subcutaneous tissue of left leg: Secondary | ICD-10-CM | POA: Diagnosis not present

## 2014-05-12 MED ORDER — GADOBENATE DIMEGLUMINE 529 MG/ML IV SOLN
20.0000 mL | Freq: Once | INTRAVENOUS | Status: AC | PRN
  Administered 2014-05-12: 20 mL via INTRAVENOUS

## 2014-06-02 DIAGNOSIS — L237 Allergic contact dermatitis due to plants, except food: Secondary | ICD-10-CM | POA: Diagnosis not present

## 2014-08-06 DIAGNOSIS — D492 Neoplasm of unspecified behavior of bone, soft tissue, and skin: Secondary | ICD-10-CM | POA: Diagnosis not present

## 2014-08-06 DIAGNOSIS — C495 Malignant neoplasm of connective and soft tissue of pelvis: Secondary | ICD-10-CM | POA: Diagnosis not present

## 2014-08-11 DIAGNOSIS — M545 Low back pain: Secondary | ICD-10-CM | POA: Diagnosis not present

## 2014-08-11 DIAGNOSIS — M4726 Other spondylosis with radiculopathy, lumbar region: Secondary | ICD-10-CM | POA: Diagnosis not present

## 2014-09-13 DIAGNOSIS — K2970 Gastritis, unspecified, without bleeding: Secondary | ICD-10-CM | POA: Diagnosis not present

## 2014-09-21 DIAGNOSIS — R1013 Epigastric pain: Secondary | ICD-10-CM | POA: Diagnosis not present

## 2014-09-21 DIAGNOSIS — K2900 Acute gastritis without bleeding: Secondary | ICD-10-CM | POA: Diagnosis not present

## 2014-09-21 DIAGNOSIS — K219 Gastro-esophageal reflux disease without esophagitis: Secondary | ICD-10-CM | POA: Diagnosis not present

## 2014-09-23 DIAGNOSIS — R1013 Epigastric pain: Secondary | ICD-10-CM | POA: Diagnosis not present

## 2014-09-23 DIAGNOSIS — K219 Gastro-esophageal reflux disease without esophagitis: Secondary | ICD-10-CM | POA: Diagnosis not present

## 2014-10-01 DIAGNOSIS — R1011 Right upper quadrant pain: Secondary | ICD-10-CM | POA: Diagnosis not present

## 2014-10-01 DIAGNOSIS — K8020 Calculus of gallbladder without cholecystitis without obstruction: Secondary | ICD-10-CM | POA: Diagnosis not present

## 2014-10-08 DIAGNOSIS — R109 Unspecified abdominal pain: Secondary | ICD-10-CM | POA: Diagnosis not present

## 2014-10-08 DIAGNOSIS — K8020 Calculus of gallbladder without cholecystitis without obstruction: Secondary | ICD-10-CM | POA: Diagnosis not present

## 2014-10-11 DIAGNOSIS — Z23 Encounter for immunization: Secondary | ICD-10-CM | POA: Diagnosis not present

## 2014-10-26 DIAGNOSIS — R1013 Epigastric pain: Secondary | ICD-10-CM | POA: Diagnosis not present

## 2014-10-26 DIAGNOSIS — K5900 Constipation, unspecified: Secondary | ICD-10-CM | POA: Diagnosis not present

## 2014-10-26 DIAGNOSIS — K30 Functional dyspepsia: Secondary | ICD-10-CM | POA: Diagnosis not present

## 2014-11-18 DIAGNOSIS — M4726 Other spondylosis with radiculopathy, lumbar region: Secondary | ICD-10-CM | POA: Diagnosis not present

## 2014-11-18 DIAGNOSIS — M545 Low back pain: Secondary | ICD-10-CM | POA: Diagnosis not present

## 2014-11-18 DIAGNOSIS — M4716 Other spondylosis with myelopathy, lumbar region: Secondary | ICD-10-CM | POA: Diagnosis not present

## 2014-11-24 DIAGNOSIS — M25552 Pain in left hip: Secondary | ICD-10-CM | POA: Diagnosis not present

## 2014-12-21 NOTE — Patient Instructions (Addendum)
Alvin [name]  12/21/2014   Your procedure is scheduled on:  12/27/2014     Report to [facility]  Entrance take [facility]  elevators to 3rd floor to  [place] at    1230pm  Call this number if you have problems the morning of surgery [phone]   Remember: ONLY 1 PERSON MAY GO WITH YOU TO SHORT STAY TO GET  READY MORNING OF YOUR SURGERY.  Do not eat food after midnite.  May have clear liquids from 12 midnite until 830am morning of surgery then nothing by mouth.       Take these medicines the morning of surgery with A SIP OF WATER: Gabapentin                                You may not have any metal on your body including hair pins and              piercings  Do not wear jewelry,  lotions, powders or perfumes, deodorant                         Men may shave face and neck.   Do not bring valuables to the hospital. [place].  Contacts, dentures or bridgework may not be worn into surgery.  Leave suitcase in the car. After surgery it may be brought to your room.       Special Instructions: coughing and deep breathing exercises, leg exercises    CLEAR LIQUID DIET   Foods Allowed                                                                     Foods Excluded  Coffee and tea, regular and decaf                             liquids that you cannot  Plain Jell-O in any flavor                                             see through such as: Fruit ices (not with fruit pulp)                                     milk, soups, orange juice  Iced Popsicles                                    All solid food Carbonated beverages, regular and diet  Cranberry, grape and apple juices Sports drinks like Gatorade Lightly seasoned clear broth or consume(fat free) Sugar, honey syrup  Sample Menu Breakfast                                Lunch                                      Supper Cranberry juice                    Beef broth                            Chicken broth Jell-O                                     Grape juice                           Apple juice Coffee or tea                        Jell-O                                      Popsicle                                                Coffee or tea                        Coffee or tea  _____________________________________________________________________                Please read over the following fact sheets you were given: _____________________________________________________________________             [facility] - Preparing for Surgery Before surgery, you can play an important role.  Because skin is not sterile, your skin needs to be as free of germs as possible.  You can reduce the number of germs on your skin by washing with CHG (chlorahexidine gluconate) soap before surgery.  CHG is an antiseptic cleaner which kills germs and bonds with the skin to continue killing germs even after washing. Please DO NOT use if you have an allergy to CHG or antibacterial soaps.  If your skin becomes reddened/irritated stop using the CHG and inform your nurse when you arrive at Short Stay. Do not shave (including legs and underarms) for at least 48 hours prior to the first CHG shower.  You may shave your face/neck. Please follow these instructions carefully:  1.  Shower with CHG Soap the night before surgery and the  morning of Surgery.  2.  If you choose to wash your hair, wash your hair first as usual with your  normal  shampoo.  3.  After you shampoo, rinse your hair and body thoroughly to remove the  shampoo.  4.  Use CHG as you would any other liquid soap.  You can apply chg directly  to the skin and wash                       Gently with a scrungie or clean washcloth.  5.  Apply the CHG Soap to your body ONLY FROM THE NECK DOWN.   Do not use on face/ open                            Wound or open sores. Avoid contact with eyes, ears mouth and genitals (private parts).                       Wash face,  Genitals (private parts) with your normal soap.             6.  Wash thoroughly, paying special attention to the area where your surgery  will be performed.  7.  Thoroughly rinse your body with warm water from the neck down.  8.  DO NOT shower/wash with your normal soap after using and rinsing off  the CHG Soap.                9.  Pat yourself dry with a clean towel.            10.  Wear clean pajamas.            11.  Place clean sheets on your bed the night of your first shower and do not  sleep with pets. Day of Surgery : Do not apply any lotions/deodorants the morning of surgery.  Please wear clean clothes to the hospital/surgery center.  FAILURE TO FOLLOW THESE INSTRUCTIONS MAY RESULT IN THE CANCELLATION OF YOUR SURGERY PATIENT SIGNATURE_________________________________  NURSE SIGNATURE__________________________________  ________________________________________________________________________   Adam Phenix  An incentive spirometer is a tool that can help keep your lungs clear and active. This tool measures how well you are filling your lungs with each breath. Taking long deep breaths may help reverse or decrease the chance of developing breathing (pulmonary) problems (especially infection) following:  A long period of time when you are unable to move or be active. BEFORE THE PROCEDURE   If the spirometer includes an indicator to show your best effort, your nurse or respiratory therapist will set it to a desired goal.  If possible, sit up straight or lean slightly forward. Try not to slouch.  Hold the incentive spirometer in an upright position. INSTRUCTIONS FOR USE   Sit on the edge of your bed if possible, or sit up as far as you can in bed or on a chair.  Hold the incentive spirometer in an upright position.  Breathe out normally.  Place the  mouthpiece in your mouth and seal your lips tightly around it.  Breathe in slowly and as deeply as possible, raising the piston or the ball toward the top of the column.  Hold your breath for 3-5 seconds or for as long as possible. Allow the piston or ball to fall to the bottom of the column.  Remove the mouthpiece from your mouth and breathe out normally.  Rest for a few seconds and repeat Steps 1 through 7 at least 10 times every 1-2 hours when you are awake. Take your time and take a few normal breaths between deep breaths.  The spirometer may include an indicator to  show your best effort. Use the indicator as a goal to work toward during each repetition.  After each set of 10 deep breaths, practice coughing to be sure your lungs are clear. If you have an incision (the cut made at the time of surgery), support your incision when coughing by placing a pillow or rolled up towels firmly against it. Once you are able to get out of bed, walk around indoors and cough well. You may stop using the incentive spirometer when instructed by your caregiver.  RISKS AND COMPLICATIONS  Take your time so you do not get dizzy or light-headed.  If you are in pain, you may need to take or ask for pain medication before doing incentive spirometry. It is harder to take a deep breath if you are having pain. AFTER USE  Rest and breathe slowly and easily.  It can be helpful to keep track of a log of your progress. Your caregiver can provide you with a simple table to help with this. If you are using the spirometer at home, follow these instructions: [place] IF:   You are having difficultly using the spirometer.  You have trouble using the spirometer as often as instructed.  Your pain medication is not giving enough relief while using the spirometer.  You develop fever of 100.5 F (38.1 C) or higher. SEEK IMMEDIATE MEDICAL CARE IF:   You cough up bloody sputum that had not been present  before.  You develop fever of 102 F (38.9 C) or greater.  You develop worsening pain at or near the incision site. MAKE SURE YOU:   Understand these instructions.  Will watch your condition.  Will get help right away if you are not doing well or get worse. Document Released: 05/14/2006 Document Revised: 03/26/2011 Document Reviewed: 07/15/2006 Exit[employer] Patient Information 2014 [employer], [place].   ________________________________________________________________________  WHAT IS A BLOOD TRANSFUSION? Blood Transfusion Information  A transfusion is the replacement of blood or some of its parts. Blood is made up of multiple cells which provide different functions.  Red blood cells carry oxygen and are used for blood loss replacement.  White blood cells fight against infection.  Platelets control bleeding.  Plasma helps clot blood.  Other blood products are available for specialized needs, such as hemophilia or other clotting disorders. BEFORE THE TRANSFUSION  Who gives blood for transfusions?   Healthy volunteers who are fully evaluated to make sure their blood is safe. This is blood bank blood. Transfusion therapy is the safest it has ever been in the practice of medicine. Before blood is taken from a donor, a complete history is taken to make sure that person has no history of diseases nor engages in risky social behavior (examples are intravenous drug use or sexual activity with multiple partners). The donor's travel history is screened to minimize risk of transmitting infections, such as malaria. The donated blood is tested for signs of infectious diseases, such as HIV and hepatitis. The blood is then tested to be sure it is compatible with you in order to minimize the chance of a transfusion reaction. If you or a relative donates blood, this is often done in anticipation of surgery and is not appropriate for emergency situations. It takes many days to process the donated  blood. RISKS AND COMPLICATIONS Although transfusion therapy is very safe and saves many lives, the main dangers of transfusion include:   Getting an infectious disease.  Developing a transfusion reaction. This is an allergic reaction  to something in the blood you were given. Every precaution is taken to prevent this. The decision to have a blood transfusion has been considered carefully by your caregiver before blood is given. Blood is not given unless the benefits outweigh the risks. AFTER THE TRANSFUSION  Right after receiving a blood transfusion, you will usually feel much better and more energetic. This is especially true if your red blood cells have gotten low (anemic). The transfusion raises the level of the red blood cells which carry oxygen, and this usually causes an energy increase.  The nurse administering the transfusion will monitor you carefully for complications. HOME CARE INSTRUCTIONS  No special instructions are needed after a transfusion. You may find your energy is better. Speak with your caregiver about any limitations on activity for underlying diseases you may have. SEEK MEDICAL CARE IF:   Your condition is not improving after your transfusion.  You develop redness or irritation at the intravenous (IV) site. SEEK IMMEDIATE MEDICAL CARE IF:  Any of the following symptoms occur over the next 12 hours:  Shaking chills.  You have a temperature by mouth above 102 F (38.9 C), not controlled by medicine.  Chest, back, or muscle pain.  People around you feel you are not acting correctly or are confused.  Shortness of breath or difficulty breathing.  Dizziness and fainting.  You get a rash or develop hives.  You have a decrease in urine output.  Your urine turns a dark color or changes to pink, red, or brown. Any of the following symptoms occur over the next 10 days:  You have a temperature by mouth above 102 F (38.9 C), not controlled by  medicine.  Shortness of breath.  Weakness after normal activity.  The white part of the eye turns yellow (jaundice).  You have a decrease in the amount of urine or are urinating less often.  Your urine turns a dark color or changes to pink, red, or brown. Document Released: 12/30/1999 Document Revised: 03/26/2011 Document Reviewed: 08/18/2007 [facility] Patient Information 2014 [place], [place].  _______________________________________________________________________

## 2014-12-23 ENCOUNTER — Encounter (HOSPITAL_COMMUNITY): Payer: Self-pay

## 2014-12-23 ENCOUNTER — Encounter (HOSPITAL_COMMUNITY)
Admission: RE | Admit: 2014-12-23 | Discharge: 2014-12-23 | Disposition: A | Discharge status: Home / Self Care | Payer: Medicare Other | Source: Ambulatory Visit | Attending: Orthopedic Surgery | Admitting: Orthopedic Surgery

## 2014-12-23 DIAGNOSIS — D1739 Benign lipomatous neoplasm of skin and subcutaneous tissue of other sites: Secondary | ICD-10-CM | POA: Insufficient documentation

## 2014-12-23 DIAGNOSIS — Z01818 Encounter for other preprocedural examination: Secondary | ICD-10-CM | POA: Diagnosis not present

## 2014-12-23 HISTORY — DX: Calculus of gallbladder without cholecystitis without obstruction: K80.20

## 2014-12-23 HISTORY — DX: Unspecified osteoarthritis, unspecified site: M19.90

## 2014-12-23 HISTORY — DX: Benign lipomatous neoplasm, unspecified: D17.9

## 2014-12-23 LAB — URINE MICROSCOPIC-ADD ON
RBC / HPF: NONE SEEN RBC/hpf (ref 0–5)
SQUAMOUS EPITHELIAL / LPF: NONE SEEN

## 2014-12-23 LAB — URINALYSIS, ROUTINE W REFLEX MICROSCOPIC
Bilirubin Urine: NEGATIVE
Glucose, UA: NEGATIVE mg/dL
Hgb urine dipstick: NEGATIVE
KETONES UR: NEGATIVE mg/dL
LEUKOCYTES UA: NEGATIVE
NITRITE: NEGATIVE
PROTEIN: NEGATIVE mg/dL
Specific Gravity, Urine: 1.018 (ref 1.005–1.030)
pH: 7.5 (ref 5.0–8.0)

## 2014-12-23 LAB — PROTIME-INR
INR: 1.04 (ref 0.00–1.49)
PROTHROMBIN TIME: 13.8 s (ref 11.6–15.2)

## 2014-12-23 LAB — BASIC METABOLIC PANEL
ANION GAP: 9 (ref 5–15)
BUN: 10 mg/dL (ref 6–20)
CALCIUM: 10.8 mg/dL — AB (ref 8.9–10.3)
CO2: 29 mmol/L (ref 22–32)
Chloride: 106 mmol/L (ref 101–111)
Creatinine, Ser: 0.81 mg/dL (ref 0.61–1.24)
GFR calc Af Amer: >60 mL/min (ref >60)
GFR calc non Af Amer: >60 mL/min (ref >60)
GLUCOSE: 107 mg/dL — AB (ref 65–99)
Potassium: 4.8 mmol/L (ref 3.5–5.1)
Sodium: 144 mmol/L (ref 135–145)

## 2014-12-23 LAB — CBC
HCT: 45 % (ref 39.0–52.0)
HEMOGLOBIN: 15 g/dL (ref 13.0–17.0)
MCH: 30.9 pg (ref 26.0–34.0)
MCHC: 33.3 g/dL (ref 30.0–36.0)
MCV: 92.8 fL (ref 78.0–100.0)
Platelets: 363 10*3/uL (ref 150–400)
RBC: 4.85 MIL/uL (ref 4.22–5.81)
RDW: 12.6 % (ref 11.5–15.5)
WBC: 5 10*3/uL (ref 4.0–10.5)

## 2014-12-23 LAB — ABO/RH: ABO/RH(D): O POS

## 2014-12-23 LAB — APTT: aPTT: 31 seconds (ref 24–37)

## 2014-12-23 NOTE — Progress Notes (Signed)
Micro and ua faxed by epic to dr [name]

## 2014-12-26 NOTE — H&P (Signed)
Alvin [name] is an 54 y.o. male.    Chief Complaint:   Painful palpable left hip mass  Procedure:   Excision of left hip mass  HPI:    Alvin [name] is a 54 year old male with complaints of a palpable left hip mass. MRI had been ordered. Dr. 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 His daughter acted as a Optometrist at that time and at that point, we had left it up to him to decide if he wanted to have any surgery done on it. He subsequently has returned to the clinic and has indicating that he at this point would like to have this lipoma excised.  He is seen and evaluated in the office. Focused examination of the left anterior aspect of his proximal thigh revealed a palpable mass. There is no overlying skin changes. He has retained active hip flexion.  He tolerates hip range of motion without significant pain.  After reviewing with Alvin [name] his current presentation as well as the review of this MRI that was performed in April, he would like for [place] to proceed with excision of his proximal thigh lipoma.   Dr. Alvan [name] discussed the risks of intraoperative findings. If it does appear to be a completely benign process, then we will most likely not need to send this off to the pathologist, but we may do it just because he has had previous lipoma excision in the past in other locations.  Dr. Alvan [name] discussed the potential for numbness over the anterior aspect of the thigh related to the dissection in the superficial nerves in this distribution. Will use an anterior based incision, but will start it as lateral as possible in order to gain exposure to the subrectus region.  Dr. Alvan [name] anticipates that he be able to be in the hospital just overnight to make sure he is comfortable with pain prior to proceeding to discharge. Minor risks of infection and DVT discussed.  PCP: Alvin Heck, MD  D/C Plans:      Home with  HHPT  Post-op Meds:       No Rx given   Decadron:      Is to be given  FYI:     ASA post-op  Norco post-op   PMH: Past Medical History  Diagnosis Date  . Gallstones     scan 3 months ago per pt  . DJD (degenerative joint disease)   . Lipoma     left thigh    PSH: Past Surgical History  Procedure Laterality Date  . Back surgery  2014    lower back, screws and rod  . 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 His smoking use included Cigarettes. He has never used smokeless tobacco. He reports that he drinks alcohol. He reports that he does not use illicit drugs.  Allergies:  No Known Allergies  Medications: No current facility-administered medications for this encounter.   Current Outpatient Prescriptions  Medication Sig Dispense Refill  . gabapentin (NEURONTIN) 300 MG capsule Take 300 mg by mouth daily before lunch.     . naproxen (NAPROSYN) 500 MG tablet Take 500 mg by mouth 2 (two) times daily as needed (pain).    . traZODone (DESYREL) 50 MG tablet Take 50 mg by mouth at bedtime.  Review of Systems  Constitutional: Negative.   HENT: Negative.   Eyes: Negative.   Respiratory: Negative.   Cardiovascular: Negative.   Gastrointestinal: Negative.   Genitourinary: Negative.   Musculoskeletal: Positive for joint pain.  Skin: Negative.   Neurological: Negative.   Endo/Heme/Allergies: Negative.   Psychiatric/Behavioral: Negative.        Physical Exam  Constitutional: He is oriented to person, place, and time. He appears well-developed.  HENT:  Head: Normocephalic.  Eyes: Pupils are equal, round, and reactive to light.  Neck: Neck supple. No JVD present. No tracheal deviation present. No thyromegaly present.  Cardiovascular: Normal rate, regular rhythm, normal heart sounds and intact distal pulses.   Respiratory: Effort normal and breath sounds normal. No stridor. No respiratory distress. He has no  wheezes.  GI: Soft. There is no tenderness. There is no guarding.  Musculoskeletal:       Left hip: He exhibits tenderness and deformity (mass). He exhibits normal range of motion and no bony tenderness.       Legs: Lymphadenopathy:    He has no cervical adenopathy.  Neurological: He is alert and oriented to person, place, and time.  Skin: Skin is warm and dry.  Psychiatric: He has a normal mood and affect.       Assessment/Plan Assessment:    Painful palpable left hip mass  Plan: Patient will undergo an excision of left hip mass on 12/27/2014 per Dr. Alvan [name] at [facility]. Risks benefits and expectations were discussed with the patient. Patient understand risks, benefits and expectations and wishes to proceed.   Alvin Pugh [name]   PA-C  12/26/2014, 10:58 AM

## 2014-12-27 ENCOUNTER — Encounter (HOSPITAL_COMMUNITY)
Admission: RE | Disposition: A | Discharge status: Home / Self Care | Payer: Self-pay | Source: Ambulatory Visit | Attending: Orthopedic Surgery

## 2014-12-27 ENCOUNTER — Encounter (HOSPITAL_COMMUNITY): Payer: Self-pay | Admitting: *Deleted

## 2014-12-27 ENCOUNTER — Ambulatory Visit (HOSPITAL_COMMUNITY): Payer: Medicare Other | Admitting: Anesthesiology

## 2014-12-27 ENCOUNTER — Observation Stay (HOSPITAL_COMMUNITY)
Admission: RE | Admit: 2014-12-27 | Discharge: 2014-12-28 | Disposition: A | Discharge status: Home / Self Care | Payer: Medicare Other | Source: Ambulatory Visit | Attending: Orthopedic Surgery | Admitting: Orthopedic Surgery

## 2014-12-27 DIAGNOSIS — M1990 Unspecified osteoarthritis, unspecified site: Secondary | ICD-10-CM | POA: Diagnosis not present

## 2014-12-27 DIAGNOSIS — D1724 Benign lipomatous neoplasm of skin and subcutaneous tissue of left leg: Secondary | ICD-10-CM | POA: Diagnosis not present

## 2014-12-27 DIAGNOSIS — Z87891 Personal history of nicotine dependence: Secondary | ICD-10-CM | POA: Diagnosis not present

## 2014-12-27 DIAGNOSIS — D2122 Benign neoplasm of connective and other soft tissue of left lower limb, including hip: Secondary | ICD-10-CM | POA: Diagnosis not present

## 2014-12-27 DIAGNOSIS — Z791 Long term (current) use of non-steroidal anti-inflammatories (NSAID): Secondary | ICD-10-CM | POA: Insufficient documentation

## 2014-12-27 DIAGNOSIS — R2242 Localized swelling, mass and lump, left lower limb: Secondary | ICD-10-CM | POA: Diagnosis present

## 2014-12-27 DIAGNOSIS — Z79899 Other long term (current) drug therapy: Secondary | ICD-10-CM | POA: Diagnosis not present

## 2014-12-27 DIAGNOSIS — D1720 Benign lipomatous neoplasm of skin and subcutaneous tissue of unspecified limb: Secondary | ICD-10-CM | POA: Diagnosis present

## 2014-12-27 HISTORY — PX: LIPOMA EXCISION: SHX5283

## 2014-12-27 LAB — TYPE AND SCREEN
ABO/RH(D): O POS
Antibody Screen: NEGATIVE

## 2014-12-27 SURGERY — EXCISION LIPOMA
Anesthesia: General | Site: Thigh | Laterality: Left

## 2014-12-27 MED ORDER — CEFAZOLIN SODIUM-DEXTROSE 2-3 GM-% IV SOLR
2.0000 g | INTRAVENOUS | Status: AC
  Administered 2014-12-27: 2 g via INTRAVENOUS

## 2014-12-27 MED ORDER — FENTANYL CITRATE (PF) 100 MCG/2ML IJ SOLN
25.0000 ug | INTRAMUSCULAR | Status: DC | PRN
  Administered 2014-12-27 (×2): 25 ug via INTRAVENOUS
  Administered 2014-12-27: 50 ug via INTRAVENOUS

## 2014-12-27 MED ORDER — MIDAZOLAM HCL 2 MG/2ML IJ SOLN
INTRAMUSCULAR | Status: AC
  Filled 2014-12-27: qty 2

## 2014-12-27 MED ORDER — PROPOFOL 10 MG/ML IV BOLUS
INTRAVENOUS | Status: AC
  Filled 2014-12-27: qty 20

## 2014-12-27 MED ORDER — PHENOL 1.4 % MT LIQD: 1.0000 | OROMUCOSAL | Status: DC | PRN

## 2014-12-27 MED ORDER — CEFAZOLIN SODIUM-DEXTROSE 2-3 GM-% IV SOLR
INTRAVENOUS | Status: AC
  Filled 2014-12-27: qty 50

## 2014-12-27 MED ORDER — DOCUSATE SODIUM 100 MG PO CAPS: 100.0000 mg | ORAL_CAPSULE | Freq: Two times a day (BID) | ORAL | Status: DC

## 2014-12-27 MED ORDER — GABAPENTIN 300 MG PO CAPS
300.0000 mg | ORAL_CAPSULE | Freq: Every day | ORAL | Status: DC
  Filled 2014-12-27: qty 1

## 2014-12-27 MED ORDER — DEXAMETHASONE SODIUM PHOSPHATE 10 MG/ML IJ SOLN: 10.0000 mg | Freq: Once | INTRAMUSCULAR | Status: DC

## 2014-12-27 MED ORDER — FENTANYL CITRATE (PF) 100 MCG/2ML IJ SOLN
INTRAMUSCULAR | Status: AC
  Administered 2014-12-27: 25 ug via INTRAVENOUS
  Filled 2014-12-27: qty 2

## 2014-12-27 MED ORDER — BISACODYL 10 MG RE SUPP: 10.0000 mg | Freq: Every day | RECTAL | Status: DC | PRN

## 2014-12-27 MED ORDER — LIDOCAINE HCL (CARDIAC) 20 MG/ML IV SOLN
INTRAVENOUS | Status: DC | PRN
  Administered 2014-12-27: 50 mg via INTRAVENOUS

## 2014-12-27 MED ORDER — DIPHENHYDRAMINE HCL 25 MG PO CAPS
25.0000 mg | ORAL_CAPSULE | Freq: Four times a day (QID) | ORAL | Status: DC | PRN
  Administered 2014-12-27: 25 mg via ORAL
  Filled 2014-12-27: qty 1

## 2014-12-27 MED ORDER — METOCLOPRAMIDE HCL 5 MG/ML IJ SOLN: 5.0000 mg | Freq: Three times a day (TID) | INTRAMUSCULAR | Status: DC | PRN

## 2014-12-27 MED ORDER — CELECOXIB 200 MG PO CAPS
200.0000 mg | ORAL_CAPSULE | Freq: Two times a day (BID) | ORAL | Status: DC
  Filled 2014-12-27 (×2): qty 1

## 2014-12-27 MED ORDER — METOCLOPRAMIDE HCL 10 MG PO TABS: 5.0000 mg | ORAL_TABLET | Freq: Three times a day (TID) | ORAL | Status: DC | PRN

## 2014-12-27 MED ORDER — 0.9 % SODIUM CHLORIDE (POUR BTL) OPTIME
TOPICAL | Status: DC | PRN
  Administered 2014-12-27: 1000 mL

## 2014-12-27 MED ORDER — LACTATED RINGERS IV SOLN: INTRAVENOUS | Status: DC

## 2014-12-27 MED ORDER — FERROUS SULFATE 325 (65 FE) MG PO TABS
325.0000 mg | ORAL_TABLET | Freq: Three times a day (TID) | ORAL | Status: DC
  Administered 2014-12-28: 325 mg via ORAL
  Filled 2014-12-27 (×5): qty 1

## 2014-12-27 MED ORDER — HYDROCODONE-ACETAMINOPHEN 7.5-325 MG PO TABS
1.0000 | ORAL_TABLET | ORAL | Status: DC
  Administered 2014-12-27: 2 via ORAL
  Filled 2014-12-27: qty 2

## 2014-12-27 MED ORDER — ONDANSETRON HCL 4 MG/2ML IJ SOLN: 4.0000 mg | Freq: Four times a day (QID) | INTRAMUSCULAR | Status: DC | PRN

## 2014-12-27 MED ORDER — POLYETHYLENE GLYCOL 3350 17 G PO PACK: 17.0000 g | PACK | Freq: Two times a day (BID) | ORAL | Status: DC

## 2014-12-27 MED ORDER — FENTANYL CITRATE (PF) 100 MCG/2ML IJ SOLN
INTRAMUSCULAR | Status: AC
  Filled 2014-12-27: qty 2

## 2014-12-27 MED ORDER — METHOCARBAMOL 500 MG PO TABS
500.0000 mg | ORAL_TABLET | Freq: Four times a day (QID) | ORAL | Status: DC | PRN
Start: 2014-12-27 — End: 2014-12-28
  Filled 2014-12-27: qty 1

## 2014-12-27 MED ORDER — ASPIRIN EC 325 MG PO TBEC
325.0000 mg | DELAYED_RELEASE_TABLET | Freq: Two times a day (BID) | ORAL | Status: DC
  Administered 2014-12-28: 325 mg via ORAL
  Filled 2014-12-27 (×3): qty 1

## 2014-12-27 MED ORDER — ONDANSETRON HCL 4 MG PO TABS: 4.0000 mg | ORAL_TABLET | Freq: Four times a day (QID) | ORAL | Status: DC | PRN

## 2014-12-27 MED ORDER — PROPOFOL 10 MG/ML IV BOLUS
INTRAVENOUS | Status: DC | PRN
  Administered 2014-12-27: 200 mg via INTRAVENOUS

## 2014-12-27 MED ORDER — MENTHOL 3 MG MT LOZG: 1.0000 | LOZENGE | OROMUCOSAL | Status: DC | PRN

## 2014-12-27 MED ORDER — MIDAZOLAM HCL 5 MG/5ML IJ SOLN
INTRAMUSCULAR | Status: DC | PRN
  Administered 2014-12-27: 2 mg via INTRAVENOUS

## 2014-12-27 MED ORDER — LACTATED RINGERS IV SOLN
INTRAVENOUS | Status: DC
  Administered 2014-12-27 (×2): via INTRAVENOUS
  Administered 2014-12-27: 1000 mL via INTRAVENOUS

## 2014-12-27 MED ORDER — TRAZODONE HCL 50 MG PO TABS
50.0000 mg | ORAL_TABLET | Freq: Every day | ORAL | Status: DC
  Filled 2014-12-27: qty 1

## 2014-12-27 MED ORDER — CEFAZOLIN SODIUM-DEXTROSE 2-3 GM-% IV SOLR
2.0000 g | Freq: Four times a day (QID) | INTRAVENOUS | Status: AC
  Administered 2014-12-27 – 2014-12-28 (×2): 2 g via INTRAVENOUS
  Filled 2014-12-27 (×2): qty 50

## 2014-12-27 MED ORDER — POTASSIUM CHLORIDE 2 MEQ/ML IV SOLN
100.0000 mL/h | INTRAVENOUS | Status: DC
  Administered 2014-12-27: 100 mL/h via INTRAVENOUS
  Filled 2014-12-27 (×3): qty 1000

## 2014-12-27 MED ORDER — ALUM & MAG HYDROXIDE-SIMETH 200-200-20 MG/5ML PO SUSP: 30.0000 mL | ORAL | Status: DC | PRN

## 2014-12-27 MED ORDER — ONDANSETRON HCL 4 MG/2ML IJ SOLN
INTRAMUSCULAR | Status: DC | PRN
  Administered 2014-12-27: 4 mg via INTRAVENOUS

## 2014-12-27 MED ORDER — METHOCARBAMOL 1000 MG/10ML IJ SOLN
500.0000 mg | Freq: Four times a day (QID) | INTRAVENOUS | Status: DC | PRN
  Administered 2014-12-27: 500 mg via INTRAVENOUS
  Filled 2014-12-27 (×2): qty 5

## 2014-12-27 MED ORDER — HYDROMORPHONE HCL 1 MG/ML IJ SOLN
0.5000 mg | INTRAMUSCULAR | Status: DC | PRN
  Administered 2014-12-27: 1 mg via INTRAVENOUS
  Filled 2014-12-27: qty 1

## 2014-12-27 MED ORDER — FENTANYL CITRATE (PF) 100 MCG/2ML IJ SOLN
INTRAMUSCULAR | Status: DC | PRN
  Administered 2014-12-27 (×4): 50 ug via INTRAVENOUS

## 2014-12-27 MED ORDER — PROMETHAZINE HCL 25 MG/ML IJ SOLN: 6.2500 mg | INTRAMUSCULAR | Status: DC | PRN

## 2014-12-27 MED ORDER — BUPIVACAINE HCL (PF) 0.25 % IJ SOLN
INTRAMUSCULAR | Status: AC
  Filled 2014-12-27: qty 30

## 2014-12-27 MED ORDER — MEPERIDINE HCL 50 MG/ML IJ SOLN: 6.2500 mg | INTRAMUSCULAR | Status: DC | PRN

## 2014-12-27 MED ORDER — MAGNESIUM CITRATE PO SOLN
1.0000 | Freq: Once | ORAL | Status: DC | PRN
Start: 2014-12-27 — End: 2014-12-28

## 2014-12-27 MED ORDER — DEXAMETHASONE SODIUM PHOSPHATE 10 MG/ML IJ SOLN
10.0000 mg | Freq: Once | INTRAMUSCULAR | Status: DC
  Filled 2014-12-27: qty 1

## 2014-12-27 MED ORDER — BUPIVACAINE-EPINEPHRINE (PF) 0.25% -1:200000 IJ SOLN
INTRAMUSCULAR | Status: AC
  Filled 2014-12-27: qty 30

## 2014-12-27 SURGICAL SUPPLY — 38 items
BAG ZIPLOCK 12X15 (MISCELLANEOUS) ×3 IMPLANT
BANDAGE ELASTIC 3 VELCRO ST LF (GAUZE/BANDAGES/DRESSINGS) ×3 IMPLANT
BLADE SURG 15 STRL LF DISP TIS (BLADE) ×1 IMPLANT
BLADE SURG 15 STRL SS (BLADE) ×2
BNDG CONFORM 3 STRL LF (GAUZE/BANDAGES/DRESSINGS) ×3 IMPLANT
BNDG GAUZE ELAST 4 BULKY (GAUZE/BANDAGES/DRESSINGS) ×3 IMPLANT
CUFF TOURN SGL QUICK 18 (TOURNIQUET CUFF) ×3 IMPLANT
DECANTER SPIKE VIAL GLASS SM (MISCELLANEOUS) ×3 IMPLANT
DRSG AQUACEL AG ADV 3.5X10 (GAUZE/BANDAGES/DRESSINGS) ×3 IMPLANT
DRSG EMULSION OIL 3X3 NADH (GAUZE/BANDAGES/DRESSINGS) ×3 IMPLANT
DRSG PAD ABDOMINAL 8X10 ST (GAUZE/BANDAGES/DRESSINGS) ×3 IMPLANT
DURAPREP 26ML APPLICATOR (WOUND CARE) ×3 IMPLANT
ELECT REM PT RETURN 9FT ADLT (ELECTROSURGICAL) ×3
ELECTRODE REM PT RTRN 9FT ADLT (ELECTROSURGICAL) ×1 IMPLANT
GAUZE SPONGE 4X4 12PLY STRL (GAUZE/BANDAGES/DRESSINGS) ×3 IMPLANT
GAUZE SPONGE 4X4 16PLY XRAY LF (GAUZE/BANDAGES/DRESSINGS) ×3 IMPLANT
GLOVE BIOGEL M 7.0 STRL (GLOVE) IMPLANT
GLOVE BIOGEL PI IND STRL 7.5 (GLOVE) ×1 IMPLANT
GLOVE BIOGEL PI IND STRL 8.5 (GLOVE) ×1 IMPLANT
GLOVE BIOGEL PI INDICATOR 7.5 (GLOVE) ×2
GLOVE BIOGEL PI INDICATOR 8.5 (GLOVE) ×2
GLOVE ECLIPSE 8.0 STRL XLNG CF (GLOVE) IMPLANT
GLOVE ORTHO TXT STRL SZ7.5 (GLOVE) ×6 IMPLANT
GLOVE SURG ORTHO 8.0 STRL STRW (GLOVE) ×3 IMPLANT
GOWN STRL REUS W/TWL LRG LVL3 (GOWN DISPOSABLE) ×3 IMPLANT
GOWN STRL REUS W/TWL XL LVL3 (GOWN DISPOSABLE) ×6 IMPLANT
KIT BASIN OR (CUSTOM PROCEDURE TRAY) ×3 IMPLANT
LIQUID BAND (GAUZE/BANDAGES/DRESSINGS) ×3 IMPLANT
MANIFOLD NEPTUNE II (INSTRUMENTS) ×3 IMPLANT
NS IRRIG 1000ML POUR BTL (IV SOLUTION) ×3 IMPLANT
PACK ORTHO EXTREMITY (CUSTOM PROCEDURE TRAY) ×3 IMPLANT
PAD CAST 3X4 CTTN HI CHSV (CAST SUPPLIES) ×1 IMPLANT
PADDING CAST COTTON 3X4 STRL (CAST SUPPLIES) ×2
POSITIONER SURGICAL ARM (MISCELLANEOUS) ×3 IMPLANT
SUT ETHILON 3 0 PS 1 (SUTURE) ×3 IMPLANT
SYR CONTROL 10ML LL (SYRINGE) ×3 IMPLANT
TOWEL OR 17X26 10 PK STRL BLUE (TOWEL DISPOSABLE) ×6 IMPLANT
WATER STERILE IRR 1500ML POUR (IV SOLUTION) ×3 IMPLANT

## 2014-12-27 NOTE — Brief Op Note (Signed)
12/27/2014  4:37 PM  PATIENT:  Erma Heritage  54 y.o. 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   PROCEDURE:  Procedure(s): EXCISION OF LEFT THIGH LIPOMA (Left)  SURGEON:  Surgeon(s) and Role:    * Paralee Cancel, MD - Primary  PHYSICIAN ASSISTANT: Danae Orleans, PA-C  ANESTHESIA:   general  EBL:  Total I/O In: 1500 [I.V.:1500] Out: 46 [Blood:50]  BLOOD ADMINISTERED:none  DRAINS: none   LOCAL MEDICATIONS USED:  NONE  SPECIMEN:  Source of Specimen:  left thigh lipoma  DISPOSITION OF SPECIMEN:  PATHOLOGY  COUNTS:  YES  TOURNIQUET:  * No tourniquets in log *  DICTATION: .Other Dictation: Dictation Number [id number]  PLAN OF CARE: Admit for overnight observation  PATIENT DISPOSITION:  PACU - hemodynamically stable.   Delay start of Pharmacological VTE agent (>24hrs) due to surgical blood loss or risk of bleeding: no

## 2014-12-27 NOTE — Progress Notes (Signed)
6th floor notifeid pt will be in 1605 in 20 minutes:  needs bed pulsox and pump.

## 2014-12-27 NOTE — Transfer of Care (Signed)
Immediate Anesthesia Transfer of Care Note  Patient: Alvin [name]  Procedure(s) Performed: Procedure(s): EXCISION OF LEFT THIGH LIPOMA (Left)  Patient Location: PACU  Anesthesia Type:General  Level of Consciousness: awake, alert  and oriented  Airway & Oxygen Therapy: Patient Spontanous Breathing and Patient connected to face mask oxygen  Post-op Assessment: Report given to RN and Post -op Vital signs reviewed and stable  Post vital signs: Reviewed and stable  Last Vitals:  Filed Vitals:   12/27/14 1248  BP: 154/78  Pulse: 64  Temp: 36.6 C  Resp: 16    Complications: No apparent anesthesia complications

## 2014-12-27 NOTE — Interval H&P Note (Signed)
History and Physical Interval Note:  12/27/2014 2:49 PM  Alvin [name]  has presented today for surgery, with the diagnosis of lipoma of left rectus femorus muscle  The various methods of treatment have been discussed with the patient and family. After consideration of risks, benefits and other options for treatment, the patient has consented to  Procedure(s): EXCISION OF LEFT THIGH LIPOMA (Left) as a surgical intervention .  The patient's history has been reviewed, patient examined, no change in status, stable for surgery.  I have reviewed the patient's chart and labs.  Questions were answered to the patient's satisfaction.     Mauri Pole

## 2014-12-27 NOTE — Anesthesia Procedure Notes (Signed)
Procedure Name: LMA Insertion Performed by: Randon Somera J Pre-anesthesia Checklist: Patient identified, Emergency Drugs available, Suction available, Patient being monitored and Timeout performed Patient Re-evaluated:Patient Re-evaluated prior to inductionOxygen Delivery Method: Circle system utilized Preoxygenation: Pre-oxygenation with 100% oxygen Intubation Type: IV induction Ventilation: Mask ventilation without difficulty LMA: LMA inserted LMA Size: 4.0 Number of attempts: 1 Placement Confirmation: positive ETCO2,  CO2 detector and breath sounds checked- equal and bilateral Tube secured with: Tape Dental Injury: Teeth and Oropharynx as per pre-operative assessment        

## 2014-12-27 NOTE — Anesthesia Postprocedure Evaluation (Signed)
Anesthesia Post Note  Patient: Macyn Haeger  Procedure(s) Performed: Procedure(s) (LRB): EXCISION OF LEFT THIGH LIPOMA (Left)  Patient location during evaluation: PACU Anesthesia Type: General Level of consciousness: awake and alert Pain management: pain level controlled Vital Signs Assessment: post-procedure vital signs reviewed and stable Respiratory status: spontaneous breathing, nonlabored ventilation, respiratory function stable and patient connected to nasal cannula oxygen Cardiovascular status: blood pressure returned to baseline and stable Postop Assessment: no signs of nausea or vomiting Anesthetic complications: no    Last Vitals:  Filed Vitals:   12/27/14 1715 12/27/14 1730  BP: 136/75 156/81  Pulse: 58 67  Temp: 36.4 C 36.4 C  Resp: 20 20    Last Pain:  Filed Vitals:   12/27/14 1734  PainSc: Luckey Hollis

## 2014-12-27 NOTE — Anesthesia Preprocedure Evaluation (Addendum)
Anesthesia Evaluation  Patient identified by MRN, date of birth, ID band Patient awake    Reviewed: Allergy & Precautions, NPO status , Patient's Chart, lab work & pertinent test results  Airway Mallampati: II  TM Distance: >3 FB Neck ROM: Full    Dental  (+) Partial Upper   Pulmonary former smoker,    breath sounds clear to auscultation       Cardiovascular negative cardio ROS   Rhythm:Regular Rate:Normal     Neuro/Psych negative neurological ROS  negative psych ROS   GI/Hepatic negative GI ROS, Neg liver ROS,   Endo/Other  negative endocrine ROS  Renal/GU negative Renal ROS  negative genitourinary   Musculoskeletal  (+) Arthritis ,   Abdominal   Peds negative pediatric ROS (+)  Hematology   Anesthesia Other Findings   Reproductive/Obstetrics negative OB ROS                            Lab Results  Component Value Date   WBC 5.0 12/23/2014   HGB 15.0 12/23/2014   HCT 45.0 12/23/2014   MCV 92.8 12/23/2014   PLT 363 12/23/2014   Lab Results  Component Value Date   CREATININE 0.81 12/23/2014   BUN 10 12/23/2014   NA 144 12/23/2014   K 4.8 12/23/2014   CL 106 12/23/2014   CO2 29 12/23/2014   Lab Results  Component Value Date   INR 1.04 12/23/2014     Anesthesia Physical Anesthesia Plan  ASA: II  Anesthesia Plan: General   Post-op Pain Management:    Induction: Intravenous  Airway Management Planned: LMA  Additional Equipment:   Intra-op Plan:   Post-operative Plan: Extubation in OR  Informed Consent: I have reviewed the patients History and Physical, chart, labs and discussed the procedure including the risks, benefits and alternatives for the proposed anesthesia with the patient or authorized representative who has indicated his/her understanding and acceptance.   Dental advisory given  Plan Discussed with: CRNA  Anesthesia Plan Comments:          Anesthesia Quick Evaluation

## 2014-12-28 DIAGNOSIS — Z791 Long term (current) use of non-steroidal anti-inflammatories (NSAID): Secondary | ICD-10-CM | POA: Diagnosis not present

## 2014-12-28 DIAGNOSIS — Z87891 Personal history of nicotine dependence: Secondary | ICD-10-CM | POA: Diagnosis not present

## 2014-12-28 DIAGNOSIS — M1990 Unspecified osteoarthritis, unspecified site: Secondary | ICD-10-CM | POA: Diagnosis not present

## 2014-12-28 DIAGNOSIS — Z79899 Other long term (current) drug therapy: Secondary | ICD-10-CM | POA: Diagnosis not present

## 2014-12-28 DIAGNOSIS — D1724 Benign lipomatous neoplasm of skin and subcutaneous tissue of left leg: Secondary | ICD-10-CM | POA: Diagnosis not present

## 2014-12-28 LAB — BASIC METABOLIC PANEL
Anion gap: 5 (ref 5–15)
BUN: 12 mg/dL (ref 6–20)
CHLORIDE: 104 mmol/L (ref 101–111)
CO2: 27 mmol/L (ref 22–32)
CREATININE: 0.71 mg/dL (ref 0.61–1.24)
Calcium: 9.3 mg/dL (ref 8.9–10.3)
GFR calc Af Amer: >60 mL/min (ref >60)
GFR calc non Af Amer: >60 mL/min (ref >60)
Glucose, Bld: 92 mg/dL (ref 65–99)
POTASSIUM: 3.7 mmol/L (ref 3.5–5.1)
Sodium: 136 mmol/L (ref 135–145)

## 2014-12-28 LAB — CBC
HEMATOCRIT: 38.7 % — AB (ref 39.0–52.0)
HEMOGLOBIN: 12.8 g/dL — AB (ref 13.0–17.0)
MCH: 30.4 pg (ref 26.0–34.0)
MCHC: 33.1 g/dL (ref 30.0–36.0)
MCV: 91.9 fL (ref 78.0–100.0)
Platelets: 274 10*3/uL (ref 150–400)
RBC: 4.21 MIL/uL — AB (ref 4.22–5.81)
RDW: 12.4 % (ref 11.5–15.5)
WBC: 7.3 10*3/uL (ref 4.0–10.5)

## 2014-12-28 MED ORDER — HYDROCODONE-ACETAMINOPHEN 7.5-325 MG PO TABS: 1.0000 | ORAL_TABLET | ORAL | Status: DC

## 2014-12-28 MED ORDER — POLYETHYLENE GLYCOL 3350 17 G PO PACK: 17.0000 g | PACK | Freq: Two times a day (BID) | ORAL | Status: DC

## 2014-12-28 MED ORDER — METHOCARBAMOL 500 MG PO TABS: 500.0000 mg | ORAL_TABLET | Freq: Four times a day (QID) | ORAL | Status: DC | PRN

## 2014-12-28 MED ORDER — DOCUSATE SODIUM 100 MG PO CAPS: 100.0000 mg | ORAL_CAPSULE | Freq: Every day | ORAL | Status: DC | PRN

## 2014-12-28 NOTE — Progress Notes (Signed)
PT Cancellation Note  Patient Details Name: Alvin [name] MRN: [id number] DOB: Nov 21, 1960   Cancelled Treatment:    Reason Eval/Treat Not Completed: PT screened, no needs identified, will sign off   Claretha Cooper 12/28/2014, 11:20 AM

## 2014-12-28 NOTE — Op Note (Signed)
NAMEMarland [name]  RODGERS, STOLTE NO.:  [id number]  MEDICAL RECORD NO.:  [id number]  LOCATION:  [id number]                         FACILITY:  [facility]  PHYSICIAN:  Pietro Cassis. [name], M.D.  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  PROCEDURE:  Excision of left deep thigh lipoma.  SURGEON:  Pietro Cassis. [name], M.D.  ASSISTANT:  Danae Orleans, PA-C.  Noted, Mr. Alvin [name] was present for the entirety of the case from preoperative position, perioperative management, operative extremity, general facilitation of the case, and primary wound closure.  ANESTHESIA:  General.  SPECIMENS:  The patient's lipoma was excised, and all materials were sent to Pathology to confirm the benign nature of the mass.  There were no palpable calcifications in this mass.  DRAINS:  None.  COMPLICATION:  None.  BLOOD LOSS:  Minimal.  INDICATION FOR PROCEDURE:  Alvin [name] is a 54 year old male, who has had a history of previous lipoma, was excised in various regions of his torso and buttock region.  He had presented for evaluation of anterior left hip pain.  The workup consisted of an exam finding of a mass in the anterior aspect of the hip followed by an MRI that revealed mild degenerative joint changes.  No labral pathology, but an 8 x 4 cm lipoma deep to the rectus femoris.  We discussed that he had persistent functional limitations related to aggravation in the anterior aspect of the hip.  Discussed that the excision would potentially cause persistent muscle soreness in addition to its risks of infection, recurrence of the lipoma.  Despite this discussion, he wished at this point to proceed. Consent was obtained.  Risks did include infection, DVT.  Risks  did include the neurovascular structures around this incision area including numbness on the thigh.  PROCEDURE IN DETAIL:  The patient was brought to operative theater. Once adequate anesthesia, preop antibiotics, Ancef 1 g administered, the patient was positioned supine.  His left proximal thigh was prepped and draped in the sterile fashion using a split-sheath technique.  A time-out was performed identifying the patient, planned procedure, and extremity.  As he was previously identified in the holding area, I told him that I was going to make an incision in the area of a potential anterior hip surgery.  This area was about 2 cm lateral to the anterior-superior iliac spine.  Following this incision, the tensor fascia lata muscle was identified and soft tissue then retracted medially to the interval of the rectus femoris and the tensor fascia lata.  Once, this interval was established, I used a 15 blade and opened this fascia.  Once, I had opened this fascia and extended it proximal and distal, I was able to palpate the lipoma tissue underneath the rectus femoris.  I was able to shell this out digitally and remove all of it under direct supervision.  There was some minor hemostasis required in the bed of the lipoma.  Once, the  lipoma was excised, we irrigated the wound with normal saline solution.  The tensor fascia was allowed to fall back over the anterior aspect of the hip, and the interval between the tensor fascia and the rectus femoris was reapproximated using #1 Vicryl suture interrupted. The remainder of the wound was closed with 2-0 Vicryl and running 4-0 Monocryl.  The hip was then cleaned, dried, and dressed sterilely using surgical glue and an Aquacel dressing.  He was brought to the recovery room in stable condition.  His specimens were sent to Pathology for confirmation of the benign nature of the fatty tumorous mass.  We will keep him overnight for observation and  pain control.     Pietro Cassis [name], M.D.     MDO/MEDQ  D:  12/27/2014  T:  12/28/2014  Job:  [id number]

## 2014-12-28 NOTE — Care Management Note (Signed)
Case Management Note  Patient Details  Name: Alvin [name] MRN: [id number] Date of Birth: 07-24-1960  Subjective/Objective:     S/p Excision of left deep thigh lipoma               Action/Plan: Discharge planning, no HH needs identified  Expected Discharge Date:  12/28/14               Expected Discharge Plan:  Home/Self Care  In-House Referral:  NA  Discharge planning Services  CM Consult  Post Acute Care Choice:  NA Choice offered to:  NA  DME Arranged:  N/A DME Agency:  NA  HH Arranged:  NA HH Agency:  NA  Status of Service:  Completed, signed off  Medicare Important Message Given:    Date Medicare IM Given:    Medicare IM give by:    Date Additional Medicare IM Given:    Additional Medicare Important Message give by:     If discussed at [place] of Stay Meetings, dates discussed:    Additional Comments:  Guadalupe Maple, RN 12/28/2014, 11:10 AM

## 2014-12-28 NOTE — Progress Notes (Signed)
Patient ID: Chantry Torchio, male   DOB: 10-Nov-1960, 54 y.o.   MRN: [id number] Subjective: 1 Day Post-Op Procedure(s) (LRB): EXCISION OF LEFT THIGH LIPOMA (Left)    Patient reports pain as mild.  Doing well no complaints or concerns.  Reviewed findings - fatty mass sent to Path, still pending report  Objective:   VITALS:   Filed Vitals:   12/28/14 0120 12/28/14 0539  BP: 113/57 132/73  Pulse: 68 55  Temp: 97.5 F (36.4 C) 97.6 F (36.4 C)  Resp: 18 18    Neurovascular intact Incision: dressing C/D/I  LABS  Recent Labs  12/28/14 0533  HGB 12.8*  HCT 38.7*  WBC 7.3  PLT 274     Recent Labs  12/28/14 0533  NA 136  K 3.7  BUN 12  CREATININE 0.71  GLUCOSE 92    No results for input(s): LABPT, INR in the last 72 hours.   Assessment/Plan: 1 Day Post-Op Procedure(s) (LRB): EXCISION OF LEFT THIGH LIPOMA (Left)   Plan: D/C to home today Walk with without assist device ad lib RTC in 2 weeks

## 2015-01-06 NOTE — Discharge Summary (Signed)
Physician Discharge Summary  Patient ID: Alvin [name] MRN: [id number] DOB/AGE: 1960-07-04 54 y.o.  Admit date: 12/27/2014 Discharge date: 12/28/2014   Procedures:  Procedure(s) (LRB): EXCISION OF LEFT THIGH LIPOMA (Left)  Attending Physician:  Dr. Paralee Cancel   Admission Diagnoses:   Excision of left hip mass  Discharge Diagnoses:  Active Problems:   Lipoma of thigh  Past Medical History  Diagnosis Date  . Gallstones     scan 3 months ago per pt  . DJD (degenerative joint disease)   . Lipoma     left thigh    HPI:    Alvin [name] is a 54 year old male with complaints of a palpable left hip mass. MRI had been ordered. Dr. [name] contacted him back in May over the phone with the fact that he is noted to have a lipoma that was deep to his rectus femoris. His daughter acted as a Optometrist at that time and at that point, we had left it up to him to decide if he wanted to have any surgery done on it. He subsequently has returned to the clinic and has indicating that he at this point would like to have this lipoma excised. He is seen and evaluated in the office. Focused examination of the left anterior aspect of his proximal thigh revealed a palpable mass. There is no overlying skin changes. He has retained active hip flexion. He tolerates hip range of motion without significant pain. After reviewing with Alvin [name] his current presentation as well as the review of this MRI that was performed in April, he would like for [place] to proceed with excision of his proximal thigh lipoma. Dr. [name] discussed the risks of intraoperative findings. If it does appear to be a completely benign process, then we will most likely not need to send this off to the pathologist, but we may do it just because he has had previous lipoma excision in the past in other locations. Dr. [name] discussed the potential for numbness over the anterior aspect of the thigh related to the dissection in the superficial nerves  in this distribution. Will use an anterior based incision, but will start it as lateral as possible in order to gain exposure to the subrectus region. Dr. [name] anticipates that he be able to be in the hospital just overnight to make sure he is comfortable with pain prior to proceeding to discharge. Minor risks of infection and DVT discussed.  PCP: Gerrit Heck, MD   Discharged Condition: good  Hospital Course:  Patient underwent the above stated procedure on 12/27/2014. Patient tolerated the procedure well and brought to the recovery room in good condition and subsequently to the floor.  POD #1 BP: 132/73 ; Pulse: 55 ; Temp: 97.6 F (36.4 C) ; Resp: 18 Patient reports pain as mild. Doing well no complaints or concerns. Reviewed findings - fatty mass sent to Path, still pending report  Neurovascular intact and incision: dressing C/D/I  LABS  Basename    HGB     12.8  HCT     38.7    Discharge Exam: General appearance: alert, cooperative and no distress Extremities: Homans sign is negative, no sign of DVT, no edema, redness or tenderness in the calves or thighs and no ulcers, gangrene or trophic changes  Disposition: Home with follow up in 2 weeks   Follow-up Information    Follow up with Mauri Pole, MD. Schedule an appointment as soon as possible for a visit in 2 weeks.  Specialty:  Orthopedic Surgery   Contact information:   [address] [place] [address] [id number]       Discharge Instructions    Call MD / Call 911    Complete by:  As directed   If you experience chest pain or shortness of breath, CALL 911 and be transported to the hospital emergency room.  If you develope a fever above 101 F, pus (white drainage) or increased drainage or redness at the wound, or calf pain, call your surgeon's office.     Constipation Prevention    Complete by:  As directed   Drink plenty of fluids.  Prune juice may be helpful.  You may use a stool  softener, such as Colace (over the counter) 100 mg twice a day.  Use MiraLax (over the counter) for constipation as needed.     Diet - low sodium heart healthy    Complete by:  As directed      Discharge instructions    Complete by:  As directed   Maintain surgical dressing until follow up in the clinic. If the edges start to pull up, may reinforce with tape. If the dressing is no longer working, may remove and cover with gauze and tape, but must keep the area dry and clean.  Follow up in 2 weeks at [facility]. Call with any questions or concerns.     Increase activity slowly as tolerated    Complete by:  As directed   Weight bearing as tolerated with assist device (walker, cane, etc) as directed, use it as long as suggested by your surgeon or therapist, typically at least 4-6 weeks.             Medication List    STOP taking these medications        naproxen 500 MG tablet  Commonly known as:  NAPROSYN      TAKE these medications        docusate sodium 100 MG capsule  Commonly known as:  COLACE  Take 1 capsule (100 mg total) by mouth daily as needed for mild constipation.     gabapentin 300 MG capsule  Commonly known as:  NEURONTIN  Take 300 mg by mouth daily before lunch.     HYDROcodone-acetaminophen 7.5-325 MG tablet  Commonly known as:  NORCO  Take 1-2 tablets by mouth every 4 (four) hours.     methocarbamol 500 MG tablet  Commonly known as:  ROBAXIN  Take 1 tablet (500 mg total) by mouth every 6 (six) hours as needed for muscle spasms.     polyethylene glycol packet  Commonly known as:  MIRALAX / GLYCOLAX  Take 17 g by mouth 2 (two) times daily.     traZODone 50 MG tablet  Commonly known as:  DESYREL  Take 50 mg by mouth at bedtime.         Signed: [place] Pugh. Joycelynn Fritsche   PA-C  01/06/2015, 9:56 AM

## 2015-01-11 DIAGNOSIS — Z4789 Encounter for other orthopedic aftercare: Secondary | ICD-10-CM | POA: Diagnosis not present

## 2015-01-11 DIAGNOSIS — D2122 Benign neoplasm of connective and other soft tissue of left lower limb, including hip: Secondary | ICD-10-CM | POA: Diagnosis not present

## 2016-01-03 ENCOUNTER — Other Ambulatory Visit: Payer: Self-pay | Admitting: Otolaryngology

## 2016-09-24 ENCOUNTER — Emergency Department (HOSPITAL_BASED_OUTPATIENT_CLINIC_OR_DEPARTMENT_OTHER): Payer: Medicare Other

## 2016-09-24 ENCOUNTER — Inpatient Hospital Stay (HOSPITAL_BASED_OUTPATIENT_CLINIC_OR_DEPARTMENT_OTHER)
Admission: EM | Admit: 2016-09-24 | Discharge: 2016-09-26 | DRG: 419 | Disposition: A | Discharge status: Home / Self Care | Payer: Medicare Other | Attending: Surgery | Admitting: Surgery

## 2016-09-24 ENCOUNTER — Encounter (HOSPITAL_BASED_OUTPATIENT_CLINIC_OR_DEPARTMENT_OTHER): Payer: Self-pay

## 2016-09-24 DIAGNOSIS — R1010 Upper abdominal pain, unspecified: Secondary | ICD-10-CM

## 2016-09-24 DIAGNOSIS — M1990 Unspecified osteoarthritis, unspecified site: Secondary | ICD-10-CM | POA: Diagnosis present

## 2016-09-24 DIAGNOSIS — K8020 Calculus of gallbladder without cholecystitis without obstruction: Secondary | ICD-10-CM

## 2016-09-24 DIAGNOSIS — K8000 Calculus of gallbladder with acute cholecystitis without obstruction: Secondary | ICD-10-CM | POA: Diagnosis not present

## 2016-09-24 DIAGNOSIS — K810 Acute cholecystitis: Secondary | ICD-10-CM | POA: Diagnosis present

## 2016-09-24 DIAGNOSIS — Z79899 Other long term (current) drug therapy: Secondary | ICD-10-CM | POA: Diagnosis not present

## 2016-09-24 DIAGNOSIS — Z87891 Personal history of nicotine dependence: Secondary | ICD-10-CM | POA: Diagnosis not present

## 2016-09-24 LAB — SURGICAL PCR SCREEN
MRSA, PCR: NEGATIVE
STAPHYLOCOCCUS AUREUS: POSITIVE — AB

## 2016-09-24 LAB — COMPREHENSIVE METABOLIC PANEL
ALT: 28 U/L (ref 17–63)
ANION GAP: 5 (ref 5–15)
AST: 26 U/L (ref 15–41)
Albumin: 3.5 g/dL (ref 3.5–5.0)
Alkaline Phosphatase: 77 U/L (ref 38–126)
BILIRUBIN TOTAL: 0.8 mg/dL (ref 0.3–1.2)
BUN: 11 mg/dL (ref 6–20)
CO2: 27 mmol/L (ref 22–32)
Calcium: 10 mg/dL (ref 8.9–10.3)
Chloride: 108 mmol/L (ref 101–111)
Creatinine, Ser: 0.85 mg/dL (ref 0.61–1.24)
Glucose, Bld: 111 mg/dL — ABNORMAL HIGH (ref 65–99)
POTASSIUM: 4.5 mmol/L (ref 3.5–5.1)
Sodium: 140 mmol/L (ref 135–145)
TOTAL PROTEIN: 7.2 g/dL (ref 6.5–8.1)

## 2016-09-24 LAB — CBC WITH DIFFERENTIAL/PLATELET
BAND NEUTROPHILS: 0 %
BASOS ABS: 0.1 10*3/uL (ref 0.0–0.1)
Basophils Relative: 1 %
Blasts: 0 %
EOS ABS: 0.3 10*3/uL (ref 0.0–0.7)
EOS PCT: 3 %
HCT: 35.8 % — ABNORMAL LOW (ref 39.0–52.0)
Hemoglobin: 12 g/dL — ABNORMAL LOW (ref 13.0–17.0)
LYMPHS ABS: 1.8 10*3/uL (ref 0.7–4.0)
Lymphocytes Relative: 16 %
MCH: 31 pg (ref 26.0–34.0)
MCHC: 33.5 g/dL (ref 30.0–36.0)
MCV: 92.5 fL (ref 78.0–100.0)
METAMYELOCYTES PCT: 0 %
MONO ABS: 0.9 10*3/uL (ref 0.1–1.0)
MYELOCYTES: 0 %
Monocytes Relative: 8 %
Neutro Abs: 8.3 10*3/uL — ABNORMAL HIGH (ref 1.7–7.7)
Neutrophils Relative %: 72 %
Other: 0 %
PLATELETS: 342 10*3/uL (ref 150–400)
Promyelocytes Absolute: 0 %
RBC: 3.87 MIL/uL — AB (ref 4.22–5.81)
RDW: 12.3 % (ref 11.5–15.5)
WBC: 11.4 10*3/uL — AB (ref 4.0–10.5)
nRBC: 0 /100 WBC

## 2016-09-24 LAB — CREATININE, SERUM
CREATININE: 0.8 mg/dL (ref 0.61–1.24)
GFR calc Af Amer: >60 mL/min (ref >60)

## 2016-09-24 LAB — CBC
HEMATOCRIT: 34.5 % — AB (ref 39.0–52.0)
HEMOGLOBIN: 11.6 g/dL — AB (ref 13.0–17.0)
MCH: 31.1 pg (ref 26.0–34.0)
MCHC: 33.6 g/dL (ref 30.0–36.0)
MCV: 92.5 fL (ref 78.0–100.0)
Platelets: 376 10*3/uL (ref 150–400)
RBC: 3.73 MIL/uL — AB (ref 4.22–5.81)
RDW: 12.5 % (ref 11.5–15.5)
WBC: 11.1 10*3/uL — ABNORMAL HIGH (ref 4.0–10.5)

## 2016-09-24 LAB — LIPASE, BLOOD: Lipase: 33 U/L (ref 11–51)

## 2016-09-24 MED ORDER — SODIUM CHLORIDE 0.9 % IV BOLUS (SEPSIS)
1000.0000 mL | Freq: Once | INTRAVENOUS | Status: AC
  Administered 2016-09-24: 1000 mL via INTRAVENOUS

## 2016-09-24 MED ORDER — DIPHENHYDRAMINE HCL 25 MG PO CAPS: 25.0000 mg | ORAL_CAPSULE | Freq: Four times a day (QID) | ORAL | Status: DC | PRN

## 2016-09-24 MED ORDER — PANTOPRAZOLE SODIUM 40 MG PO TBEC
40.0000 mg | DELAYED_RELEASE_TABLET | Freq: Every day | ORAL | Status: DC
  Administered 2016-09-24: 40 mg via ORAL
  Filled 2016-09-24: qty 1

## 2016-09-24 MED ORDER — LACTATED RINGERS IV SOLN
INTRAVENOUS | Status: DC
  Administered 2016-09-24: 22:00:00 via INTRAVENOUS

## 2016-09-24 MED ORDER — HEPARIN SODIUM (PORCINE) 5000 UNIT/ML IJ SOLN
5000.0000 [IU] | Freq: Three times a day (TID) | INTRAMUSCULAR | Status: DC
  Administered 2016-09-24: 5000 [IU] via SUBCUTANEOUS
  Filled 2016-09-24: qty 1

## 2016-09-24 MED ORDER — ONDANSETRON 4 MG PO TBDP: 4.0000 mg | ORAL_TABLET | Freq: Four times a day (QID) | ORAL | Status: DC | PRN

## 2016-09-24 MED ORDER — PIPERACILLIN-TAZOBACTAM 3.375 G IVPB
3.3750 g | Freq: Three times a day (TID) | INTRAVENOUS | Status: DC
  Administered 2016-09-24 – 2016-09-25 (×2): 3.375 g via INTRAVENOUS
  Filled 2016-09-24: qty 50

## 2016-09-24 MED ORDER — DEXTROSE 5 % IV SOLN
2.0000 g | Freq: Once | INTRAVENOUS | Status: DC
  Filled 2016-09-24: qty 2

## 2016-09-24 MED ORDER — ACETAMINOPHEN 325 MG PO TABS: 650.0000 mg | ORAL_TABLET | Freq: Four times a day (QID) | ORAL | Status: DC | PRN

## 2016-09-24 MED ORDER — DOCUSATE SODIUM 100 MG PO CAPS
100.0000 mg | ORAL_CAPSULE | Freq: Two times a day (BID) | ORAL | Status: DC
  Administered 2016-09-24: 100 mg via ORAL
  Filled 2016-09-24: qty 1

## 2016-09-24 MED ORDER — DEXTROSE 5 % IV SOLN
2.0000 g | Freq: Once | INTRAVENOUS | Status: AC
  Administered 2016-09-24: 2 g via INTRAVENOUS
  Filled 2016-09-24: qty 2

## 2016-09-24 MED ORDER — TRAMADOL HCL 50 MG PO TABS
50.0000 mg | ORAL_TABLET | Freq: Four times a day (QID) | ORAL | Status: DC | PRN
  Administered 2016-09-24: 50 mg via ORAL
  Filled 2016-09-24: qty 1

## 2016-09-24 MED ORDER — ONDANSETRON HCL 4 MG/2ML IJ SOLN: 4.0000 mg | Freq: Four times a day (QID) | INTRAMUSCULAR | Status: DC | PRN

## 2016-09-24 MED ORDER — HYDROMORPHONE HCL-NACL 0.5-0.9 MG/ML-% IV SOSY: 0.5000 mg | PREFILLED_SYRINGE | INTRAVENOUS | Status: DC | PRN

## 2016-09-24 MED ORDER — DIPHENHYDRAMINE HCL 50 MG/ML IJ SOLN: 25.0000 mg | Freq: Four times a day (QID) | INTRAMUSCULAR | Status: DC | PRN

## 2016-09-24 MED ORDER — HYDROCODONE-ACETAMINOPHEN 5-325 MG PO TABS: 1.0000 | ORAL_TABLET | ORAL | Status: DC | PRN

## 2016-09-24 NOTE — ED Notes (Signed)
Warm pack applied to left AC at site of infiltration.

## 2016-09-24 NOTE — ED Provider Notes (Signed)
I assumed care of this patient from Dr. Maryan Rued as a POV transfer from [facility]  Please see their note for further details of Hx, PE.  Briefly patient is a 56 y.o. male who presents with abdominal pain and diagnosed with acute cholecystitis. Dr. Maryan Rued spoke with Dr. Hassell Done from surgery who requested the patient be brought to [facility] for further evaluation and management.   Patient was unable to receive Rocephin at medicine hyperechoic due to IV infiltration. We'll give him his dose of Rocephin here. Surgery aware of the patient and is at bedside evaluating. Will admit for further management.   Fatima Blank, MD 09/24/16 [phone]

## 2016-09-24 NOTE — ED Triage Notes (Signed)
Pt reports upper abdominal pain since Tuesday of last week. Was seen at clinic, given Omeprazole. Has had minimal vomiting and some fevers. Reports hx of gallstones and sts it feels similar to that.

## 2016-09-24 NOTE — H&P (Signed)
CC: RUQ pain  HPI: Alvin [name] is an 56 y.o. male who is here from [facility] with RUQ pain which began 5d ago. Has had this kind of pain twice in the past - first episode 40yr ago, subsequent 3 years ago. Pain this go round started in [facility]G and has since localized to RUQ. Sharp, constant. Exacerbated by deep breaths, spicy and fatty food. +F/c. Nausea/emesis 5d ago, none since. Last PO was this morning for breakfast. Diarrhea 5d ago, normal since. Denies yellowing of skin, eyes, dark urine or pale stool.  Past Medical History:  Diagnosis Date  . DJD (degenerative joint disease)   . Gallstones    scan 3 months ago per pt  . Lipoma    left thigh    Past Surgical History:  Procedure Laterality Date  . BACK SURGERY  2014   lower back, screws and rod  . LIPOMA EXCISION Left 12/27/2014   Procedure: EXCISION OF LEFT THIGH LIPOMA;  Surgeon: MParalee Cancel MD;  Location: [facility]S;  Service: Orthopedics;  Laterality: Left;  . 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  Social:  reports that he has quit smoking. His smoking use included Cigarettes. He has never used smokeless tobacco. He reports that he drinks alcohol. He reports that he does not use drugs.  Allergies: No Known Allergies  Medications: I have reviewed the patient's current medications.  Results for orders placed or performed during the hospital encounter of 09/24/16 (from the past 48 hour(s))  Comprehensive metabolic panel     Status: Abnormal   Collection Time: 09/24/16  1:20 PM  Result Value Ref Range   Sodium 140 135 - 145 mmol/L   Potassium 4.5 3.5 - 5.1 mmol/L   Chloride 108 101 - 111 mmol/L   CO2 27 22 - 32 mmol/L   Glucose, Bld 111 (H) 65 - 99 mg/dL   BUN 11 6 - 20 mg/dL   Creatinine, Ser 0.85 0.61 - 1.24 mg/dL   Calcium 10.0 8.9 - 10.3 mg/dL   Total Protein 7.2 6.5 - 8.1 g/dL   Albumin 3.5 3.5 - 5.0 g/dL   AST 26 15 - 41 U/L   ALT 28 17 - 63 U/L   Alkaline Phosphatase 77 38 - 126  U/L   Total Bilirubin 0.8 0.3 - 1.2 mg/dL   GFR calc non Af Amer >60 >60 mL/min   GFR calc Af Amer >60 >60 mL/min    Comment: (NOTE) The eGFR has been calculated using the CKD EPI equation. This calculation has not been validated in all clinical situations. eGFR's persistently <60 mL/min signify possible Chronic Kidney Disease.    Anion gap 5 5 - 15  Lipase, blood     Status: None   Collection Time: 09/24/16  1:20 PM  Result Value Ref Range   Lipase 33 11 - 51 U/L  CBC with Differential/Platelet     Status: Abnormal   Collection Time: 09/24/16  1:55 PM  Result Value Ref Range   WBC 11.4 (H) 4.0 - 10.5 K/uL   RBC 3.87 (L) 4.22 - 5.81 MIL/uL   Hemoglobin 12.0 (L) 13.0 - 17.0 g/dL   HCT 35.8 (L) 39.0 - 52.0 %   MCV 92.5 78.0 - 100.0 fL   MCH 31.0 26.0 - 34.0 pg   MCHC 33.5 30.0 - 36.0 g/dL   RDW 12.3 11.5 - 15.5 %   Platelets 342 150 - 400 K/uL  Neutrophils Relative % 72 %   Lymphocytes Relative 16 %   Monocytes Relative 8 %   Eosinophils Relative 3 %   Basophils Relative 1 %   Band Neutrophils 0 %   Metamyelocytes Relative 0 %   Myelocytes 0 %   Promyelocytes Absolute 0 %   Blasts 0 %   nRBC 0 0 /100 WBC   Other 0 %   Neutro Abs 8.3 (H) 1.7 - 7.7 K/uL   Lymphs Abs 1.8 0.7 - 4.0 K/uL   Monocytes Absolute 0.9 0.1 - 1.0 K/uL   Eosinophils Absolute 0.3 0.0 - 0.7 K/uL   Basophils Absolute 0.1 0.0 - 0.1 K/uL   Smear Review MORPHOLOGY UNREMARKABLE     US Abdomen Limited Ruq  Result Date: 09/24/2016 CLINICAL DATA:  Vomiting, fever, upper abdominal pain. EXAM: ULTRASOUND ABDOMEN LIMITED RIGHT UPPER QUADRANT COMPARISON:  04/20/2014 CT FINDINGS: Gallbladder: Multiple small stones, the largest 11 mm. There appears to be a small non mobile stone in the gallbladder neck. Gallbladder wall is thickened at 4 mm. Small amount of pericholecystic fluid. Common bile duct: Diameter: Normal diameter, 3 mm. Liver: No focal lesion identified. Within normal limits in parenchymal echogenicity.  Portal vein is patent on color Doppler imaging with normal direction of blood flow towards the liver. IMPRESSION: Cholelithiasis. There is mild gallbladder wall thickening and pericholecystic fluid. Findings concerning for early acute cholecystitis. Electronically Signed   By: Rolm Baptise M.D.   On: 09/24/2016 14:59    Review of Systems - General ROS: positive for  - chills and fever negative for - weight gain or weight loss Respiratory ROS: no cough, shortness of breath, or wheezing Cardiovascular ROS: no chest pain or dyspnea on exertion Gastrointestinal ROS: per HPI Dermatological ROS: negative for pruritus  PE Blood pressure (!) 145/88, pulse 70, temperature 98.6 F (37 C), temperature source Oral, resp. rate 16, height _0  (1.854 m), weight 94.8 kg (209 lb), SpO2 97 %. Gen: NAD, comfortable, pleasant HEENT: Sclera anicteric CV: RRR Pulm: Normal work of breathing GI: soft, mildly ttp in RUQ, nondistended; +Murphy's; no r/g. Ext: No pitting edema  Results for orders placed or performed during the hospital encounter of 09/24/16 (from the past 48 hour(s))  Comprehensive metabolic panel     Status: Abnormal   Collection Time: 09/24/16  1:20 PM  Result Value Ref Range   Sodium 140 135 - 145 mmol/L   Potassium 4.5 3.5 - 5.1 mmol/L   Chloride 108 101 - 111 mmol/L   CO2 27 22 - 32 mmol/L   Glucose, Bld 111 (H) 65 - 99 mg/dL   BUN 11 6 - 20 mg/dL   Creatinine, Ser 0.85 0.61 - 1.24 mg/dL   Calcium 10.0 8.9 - 10.3 mg/dL   Total Protein 7.2 6.5 - 8.1 g/dL   Albumin 3.5 3.5 - 5.0 g/dL   AST 26 15 - 41 U/L   ALT 28 17 - 63 U/L   Alkaline Phosphatase 77 38 - 126 U/L   Total Bilirubin 0.8 0.3 - 1.2 mg/dL   GFR calc non Af Amer >60 >60 mL/min   GFR calc Af Amer >60 >60 mL/min    Comment: (NOTE) The eGFR has been calculated using the CKD EPI equation. This calculation has not been validated in all clinical situations. eGFR's persistently <60 mL/min signify possible Chronic  Kidney Disease.    Anion gap 5 5 - 15  Lipase, blood     Status: None   Collection Time:  09/24/16  1:20 PM  Result Value Ref Range   Lipase 33 11 - 51 U/L  CBC with Differential/Platelet     Status: Abnormal   Collection Time: 09/24/16  1:55 PM  Result Value Ref Range   WBC 11.4 (H) 4.0 - 10.5 K/uL   RBC 3.87 (L) 4.22 - 5.81 MIL/uL   Hemoglobin 12.0 (L) 13.0 - 17.0 g/dL   HCT 35.8 (L) 39.0 - 52.0 %   MCV 92.5 78.0 - 100.0 fL   MCH 31.0 26.0 - 34.0 pg   MCHC 33.5 30.0 - 36.0 g/dL   RDW 12.3 11.5 - 15.5 %   Platelets 342 150 - 400 K/uL   Neutrophils Relative % 72 %   Lymphocytes Relative 16 %   Monocytes Relative 8 %   Eosinophils Relative 3 %   Basophils Relative 1 %   Band Neutrophils 0 %   Metamyelocytes Relative 0 %   Myelocytes 0 %   Promyelocytes Absolute 0 %   Blasts 0 %   nRBC 0 0 /100 WBC   Other 0 %   Neutro Abs 8.3 (H) 1.7 - 7.7 K/uL   Lymphs Abs 1.8 0.7 - 4.0 K/uL   Monocytes Absolute 0.9 0.1 - 1.0 K/uL   Eosinophils Absolute 0.3 0.0 - 0.7 K/uL   Basophils Absolute 0.1 0.0 - 0.1 K/uL   Smear Review MORPHOLOGY UNREMARKABLE     US Abdomen Limited Ruq  Result Date: 09/24/2016 CLINICAL DATA:  Vomiting, fever, upper abdominal pain. EXAM: ULTRASOUND ABDOMEN LIMITED RIGHT UPPER QUADRANT COMPARISON:  04/20/2014 CT FINDINGS: Gallbladder: Multiple small stones, the largest 11 mm. There appears to be a small non mobile stone in the gallbladder neck. Gallbladder wall is thickened at 4 mm. Small amount of pericholecystic fluid. Common bile duct: Diameter: Normal diameter, 3 mm. Liver: No focal lesion identified. Within normal limits in parenchymal echogenicity. Portal vein is patent on color Doppler imaging with normal direction of blood flow towards the liver. IMPRESSION: Cholelithiasis. There is mild gallbladder wall thickening and pericholecystic fluid. Findings concerning for early acute cholecystitis. Electronically Signed   By: Rolm Baptise M.D.   On: 09/24/2016 14:59    +Stones with one impacted in neck GBW 92m +PCCF CBD 436m A/P: 56yM, otherwise healthy, with acute cholecystitis  -Admit to surgery -NPO p MN -IVF, Zosyn -The planned procedure (laparoscopic versus open cholecystectomy, possible intraoperative cholangiogram), material risks (including, but not limited to, pain, bleeding, infection, scarring, need for additional procedures, bile leak, damage to surrounding structures including bowel and bile duct), benefits and alternatives to surgery were discussed. He and his wife's questions were answered and he elected to proceed with the surgery.  ChSharon MtWhDema SeverinM.D. [facility]Fort [facility]

## 2016-09-24 NOTE — ED Notes (Signed)
Bed: WA01 Expected date:  Expected time:  Means of arrival:  Comments: Transfer from [facility]

## 2016-09-24 NOTE — ED Provider Notes (Addendum)
[facility] DEPT [facility] Provider Note   CSN: [id number] Arrival date & time: 09/24/16  1154     History   Chief Complaint Chief Complaint  Patient presents with  . Abdominal Pain    HPI Alvin [name] is a 56 y.o. male.  Patient is a healthy 56 year old male with a history of gallstones presenting with epigastric and right upper quadrant pain that's worsened over the last 6 days. He was initially seen at the walk-in clinic and was told he had indigestion was given omeprazole. However patient's symptoms have not improved and he continues to have severe pain with deep breathing and pain with eating. The first day the pain started he had nausea and vomiting but has not had any since. He denies daily alcohol use or drug use. Last episode of gallstones was 3 months ago but he has never had surgery. He states this pain feels similar.  Also notes fever of over 101 intermittently over the last 3-4 days   The history is provided by the patient.  Abdominal Pain   This is a new problem. Episode onset: 6 days ago. The problem occurs constantly. The problem has been gradually worsening. The pain is associated with eating. The pain is located in the epigastric region and RUQ. The pain is at a severity of 7/10. The pain is severe. Associated symptoms include anorexia, fever, nausea and vomiting. Pertinent negatives include diarrhea, dysuria, frequency and headaches. The symptoms are aggravated by deep breathing, coughing and eating. Nothing relieves the symptoms. His past medical history is significant for gallstones.    Past Medical History:  Diagnosis Date  . DJD (degenerative joint disease)   . Gallstones    scan 3 months ago per pt  . Lipoma    left thigh    Patient Active Problem List   Diagnosis Date Noted  . Lipoma of thigh 12/27/2014    Past Surgical History:  Procedure Laterality Date  . BACK SURGERY  2014   lower back, screws and rod  . LIPOMA EXCISION Left 12/27/2014   Procedure: EXCISION OF LEFT THIGH LIPOMA;  Surgeon: Paralee Cancel, MD;  Location: [facility];  Service: Orthopedics;  Laterality: Left;  . right leg benign tumor removed  7-8 yrs ago       Home Medications    Prior to Admission medications   Medication Sig Start Date End Date Taking? Authorizing Provider  docusate sodium (COLACE) 100 MG capsule Take 1 capsule (100 mg total) by mouth daily as needed for mild constipation. 12/28/14   Danae Orleans, PA-C  gabapentin (NEURONTIN) 300 MG capsule Take 300 mg by mouth daily before lunch.     [provider]  HYDROcodone-acetaminophen (NORCO) 7.5-325 MG tablet Take 1-2 tablets by mouth every 4 (four) hours. 12/28/14   Danae Orleans, PA-C  methocarbamol (ROBAXIN) 500 MG tablet Take 1 tablet (500 mg total) by mouth every 6 (six) hours as needed for muscle spasms. 12/28/14   Danae Orleans, PA-C  polyethylene glycol (MIRALAX / GLYCOLAX) packet Take 17 g by mouth 2 (two) times daily. 12/28/14   Danae Orleans, PA-C  traZODone (DESYREL) 50 MG tablet Take 50 mg by mouth at bedtime.    [provider]    Family History No family history on file.  Social History Social History  Substance Use Topics  . Smoking status: Former Smoker    Types: Cigarettes  . Smokeless tobacco: Never Used  . Alcohol use Yes     Comment: occasional  Allergies   Patient has no known allergies.   Review of Systems Review of Systems  Constitutional: Positive for fever.  Gastrointestinal: Positive for abdominal pain, anorexia, nausea and vomiting. Negative for diarrhea.  Genitourinary: Negative for dysuria and frequency.  Neurological: Negative for headaches.  All other systems reviewed and are negative.    Physical Exam Updated Vital Signs BP (!) 150/84   Pulse 67   Temp 98.4 F (36.9 C) (Oral)   Resp 16   Ht 6\' 1"  (1.854 m)   Wt 94.8 kg (209 lb)   SpO2 98%   BMI 27.57 kg/m   Physical Exam  Constitutional: He is oriented to  person, place, and time. He appears well-developed and well-nourished. No distress.  HENT:  Head: Normocephalic and atraumatic.  Mouth/Throat: Oropharynx is clear and moist.  Eyes: Pupils are equal, round, and reactive to light. Conjunctivae and EOM are normal.  Neck: Normal range of motion. Neck supple.  Cardiovascular: Normal rate, regular rhythm and intact distal pulses.   No murmur heard. Pulmonary/Chest: Effort normal and breath sounds normal. No respiratory distress. He has no wheezes. He has no rales.  Abdominal: Soft. He exhibits no distension. There is tenderness in the right upper quadrant. There is guarding and positive Murphy's sign. There is no rebound.  Musculoskeletal: Normal range of motion. He exhibits no edema or tenderness.  Neurological: He is alert and oriented to person, place, and time.  Skin: Skin is warm and dry. No rash noted. No erythema.  Psychiatric: He has a normal mood and affect. His behavior is normal.  Nursing note and vitals reviewed.    ED Treatments / Results  Labs (all labs ordered are listed, but only abnormal results are displayed) Labs Reviewed  COMPREHENSIVE METABOLIC PANEL - Abnormal; Notable for the following:       Result Value   Glucose, Bld 111 (*)    All other components within normal limits  CBC WITH DIFFERENTIAL/PLATELET - Abnormal; Notable for the following:    WBC 11.4 (*)    RBC 3.87 (*)    Hemoglobin 12.0 (*)    HCT 35.8 (*)    Neutro Abs 8.3 (*)    All other components within normal limits  LIPASE, BLOOD  CBC WITH DIFFERENTIAL/PLATELET    EKG  EKG Interpretation  Date/Time:  Monday September 24 2016 12:11:59 EDT Ventricular Rate:  66 PR Interval:  134 QRS Duration: 94 QT Interval:  382 QTC Calculation: 400 R Axis:   -11 Text Interpretation:  Normal sinus rhythm Left ventricular hypertrophy No previous tracing Confirmed by Blanchie Dessert [phone]) on 09/24/2016 12:18:06 PM       Radiology US Abdomen Limited  Ruq  Result Date: 09/24/2016 CLINICAL DATA:  Vomiting, fever, upper abdominal pain. EXAM: ULTRASOUND ABDOMEN LIMITED RIGHT UPPER QUADRANT COMPARISON:  04/20/2014 CT FINDINGS: Gallbladder: Multiple small stones, the largest 11 mm. There appears to be a small non mobile stone in the gallbladder neck. Gallbladder wall is thickened at 4 mm. Small amount of pericholecystic fluid. Common bile duct: Diameter: Normal diameter, 3 mm. Liver: No focal lesion identified. Within normal limits in parenchymal echogenicity. Portal vein is patent on color Doppler imaging with normal direction of blood flow towards the liver. IMPRESSION: Cholelithiasis. There is mild gallbladder wall thickening and pericholecystic fluid. Findings concerning for early acute cholecystitis. Electronically Signed   By: Rolm Baptise M.D.   On: 09/24/2016 14:59    Procedures Procedures (including critical care time)  Medications Ordered in ED Medications  sodium chloride 0.9 % bolus 1,000 mL (not administered)     Initial Impression / Assessment and Plan / ED Course  I have reviewed the triage vital signs and the nursing notes.  Pertinent labs & imaging results that were available during my care of the patient were reviewed by me and considered in my medical decision making (see chart for details).     Pt presenting with RUQ/epigastric pain and fever concerning for acute cholecystitis versus hepatitis or since pancreatitis. Patient states pain is severe with deep breathing but minimal upper respiratory symptoms but possibility for potential pneumonia.  Well-appearing on exam stable vital signs. CBC, CMP, lipase, right upper quadrant ultrasound pending  3:11 PM Labs are normal except for mild cytosis of 11.4. Ultrasound concerning for early acute cholecystitis with gallbladder wall thickening, small amount of pericholecystic fluid and multiple gallstones. Given patient's intermittent fevers and ongoing pain he was given 2 g of  Rocephin and will consult general surgery.   3:30 PM Spoke with Dr. Hassell Done who requested that Pt come to the [facility].  Patient's IV infiltrated and he did not receive Rocephin. However a posted delaying transport by private vehicle to the ER patient will receive Rocephin when he gets to [facility]. This was relayed to the emergency room provider Dr. Ellender Hose and the triage nurse. Patient was told to remain strictly nothing by mouth until told otherwise. Final Clinical Impressions(s) / ED Diagnoses   Final diagnoses:  Upper abdominal pain  Acute cholecystitis    New Prescriptions New Prescriptions   No medications on file     Blanchie Dessert, MD 09/24/16 1513    Blanchie Dessert, MD 09/24/16 1530

## 2016-09-25 ENCOUNTER — Inpatient Hospital Stay (HOSPITAL_COMMUNITY): Payer: Medicare Other | Admitting: Certified Registered Nurse Anesthetist

## 2016-09-25 ENCOUNTER — Encounter (HOSPITAL_COMMUNITY): Payer: Self-pay | Admitting: Certified Registered Nurse Anesthetist

## 2016-09-25 ENCOUNTER — Encounter (HOSPITAL_COMMUNITY): Admission: EM | Disposition: A | Discharge status: Home / Self Care | Payer: Self-pay | Source: Home / Self Care

## 2016-09-25 ENCOUNTER — Inpatient Hospital Stay (HOSPITAL_COMMUNITY): Payer: Medicare Other

## 2016-09-25 DIAGNOSIS — Z87891 Personal history of nicotine dependence: Secondary | ICD-10-CM | POA: Diagnosis not present

## 2016-09-25 DIAGNOSIS — K8000 Calculus of gallbladder with acute cholecystitis without obstruction: Secondary | ICD-10-CM | POA: Diagnosis not present

## 2016-09-25 DIAGNOSIS — K810 Acute cholecystitis: Secondary | ICD-10-CM | POA: Diagnosis not present

## 2016-09-25 DIAGNOSIS — M1990 Unspecified osteoarthritis, unspecified site: Secondary | ICD-10-CM | POA: Diagnosis not present

## 2016-09-25 DIAGNOSIS — Z79899 Other long term (current) drug therapy: Secondary | ICD-10-CM | POA: Diagnosis not present

## 2016-09-25 HISTORY — PX: CHOLECYSTECTOMY: SHX55

## 2016-09-25 LAB — COMPREHENSIVE METABOLIC PANEL
ALBUMIN: 3.1 g/dL — AB (ref 3.5–5.0)
ALK PHOS: 74 U/L (ref 38–126)
ALT: 25 U/L (ref 17–63)
ANION GAP: 8 (ref 5–15)
AST: 20 U/L (ref 15–41)
BILIRUBIN TOTAL: 1 mg/dL (ref 0.3–1.2)
BUN: 10 mg/dL (ref 6–20)
CALCIUM: 10 mg/dL (ref 8.9–10.3)
CO2: 27 mmol/L (ref 22–32)
Chloride: 107 mmol/L (ref 101–111)
Creatinine, Ser: 0.84 mg/dL (ref 0.61–1.24)
GLUCOSE: 101 mg/dL — AB (ref 65–99)
POTASSIUM: 3.9 mmol/L (ref 3.5–5.1)
Sodium: 142 mmol/L (ref 135–145)
TOTAL PROTEIN: 6.6 g/dL (ref 6.5–8.1)

## 2016-09-25 LAB — CBC
HCT: 38.3 % — ABNORMAL LOW (ref 39.0–52.0)
HEMATOCRIT: 35.2 % — AB (ref 39.0–52.0)
HEMOGLOBIN: 11.6 g/dL — AB (ref 13.0–17.0)
Hemoglobin: 12.8 g/dL — ABNORMAL LOW (ref 13.0–17.0)
MCH: 30.5 pg (ref 26.0–34.0)
MCH: 31.1 pg (ref 26.0–34.0)
MCHC: 33 g/dL (ref 30.0–36.0)
MCHC: 33.4 g/dL (ref 30.0–36.0)
MCV: 92.6 fL (ref 78.0–100.0)
MCV: 93.2 fL (ref 78.0–100.0)
PLATELETS: 396 10*3/uL (ref 150–400)
Platelets: 401 10*3/uL — ABNORMAL HIGH (ref 150–400)
RBC: 3.8 MIL/uL — AB (ref 4.22–5.81)
RBC: 4.11 MIL/uL — ABNORMAL LOW (ref 4.22–5.81)
RDW: 12.6 % (ref 11.5–15.5)
RDW: 12.6 % (ref 11.5–15.5)
WBC: 10.4 10*3/uL (ref 4.0–10.5)
WBC: 11.9 10*3/uL — AB (ref 4.0–10.5)

## 2016-09-25 LAB — CREATININE, SERUM: Creatinine, Ser: 0.86 mg/dL (ref 0.61–1.24)

## 2016-09-25 LAB — HIV ANTIBODY (ROUTINE TESTING W REFLEX): HIV SCREEN 4TH GENERATION: NONREACTIVE

## 2016-09-25 SURGERY — LAPAROSCOPIC CHOLECYSTECTOMY WITH INTRAOPERATIVE CHOLANGIOGRAM
Anesthesia: General | Site: Abdomen

## 2016-09-25 MED ORDER — BUPIVACAINE LIPOSOME 1.3 % IJ SUSP
20.0000 mL | Freq: Once | INTRAMUSCULAR | Status: AC
  Administered 2016-09-25: 20 mL
  Filled 2016-09-25: qty 20

## 2016-09-25 MED ORDER — HYDROMORPHONE HCL-NACL 0.5-0.9 MG/ML-% IV SOSY
PREFILLED_SYRINGE | INTRAVENOUS | Status: AC
  Administered 2016-09-25: 0.5 mg via INTRAVENOUS
  Filled 2016-09-25: qty 4

## 2016-09-25 MED ORDER — LIDOCAINE 2% (20 MG/ML) 5 ML SYRINGE
INTRAMUSCULAR | Status: DC | PRN
  Administered 2016-09-25: 100 mg via INTRAVENOUS

## 2016-09-25 MED ORDER — ONDANSETRON HCL 4 MG/2ML IJ SOLN: 4.0000 mg | Freq: Four times a day (QID) | INTRAMUSCULAR | Status: DC | PRN

## 2016-09-25 MED ORDER — MUPIROCIN 2 % EX OINT
1.0000 "application " | TOPICAL_OINTMENT | Freq: Two times a day (BID) | CUTANEOUS | Status: DC
  Administered 2016-09-25 (×2): 1 via NASAL
  Filled 2016-09-25: qty 22

## 2016-09-25 MED ORDER — FENTANYL CITRATE (PF) 250 MCG/5ML IJ SOLN
INTRAMUSCULAR | Status: AC
  Filled 2016-09-25: qty 5

## 2016-09-25 MED ORDER — ROCURONIUM BROMIDE 50 MG/5ML IV SOSY
PREFILLED_SYRINGE | INTRAVENOUS | Status: AC
  Filled 2016-09-25: qty 5

## 2016-09-25 MED ORDER — MUPIROCIN 2 % EX OINT: 1.0000 "application " | TOPICAL_OINTMENT | Freq: Two times a day (BID) | CUTANEOUS | Status: DC

## 2016-09-25 MED ORDER — SUGAMMADEX SODIUM 200 MG/2ML IV SOLN
INTRAVENOUS | Status: AC
  Filled 2016-09-25: qty 2

## 2016-09-25 MED ORDER — MIDAZOLAM HCL 2 MG/2ML IJ SOLN
INTRAMUSCULAR | Status: AC
  Filled 2016-09-25: qty 2

## 2016-09-25 MED ORDER — FENTANYL CITRATE (PF) 100 MCG/2ML IJ SOLN
INTRAMUSCULAR | Status: DC | PRN
  Administered 2016-09-25: 50 ug via INTRAVENOUS
  Administered 2016-09-25: 100 ug via INTRAVENOUS
  Administered 2016-09-25 (×2): 50 ug via INTRAVENOUS

## 2016-09-25 MED ORDER — SODIUM CHLORIDE 0.9 % IJ SOLN
INTRAMUSCULAR | Status: AC
Start: 2016-09-25 — End: 2016-09-25
  Filled 2016-09-25: qty 10

## 2016-09-25 MED ORDER — IOPAMIDOL (ISOVUE-300) INJECTION 61%
INTRAVENOUS | Status: DC | PRN
  Administered 2016-09-25: 50 mL via INTRAVENOUS

## 2016-09-25 MED ORDER — OXYCODONE HCL 5 MG/5ML PO SOLN
5.0000 mg | Freq: Once | ORAL | Status: DC | PRN
  Filled 2016-09-25: qty 5

## 2016-09-25 MED ORDER — HYDROMORPHONE HCL-NACL 0.5-0.9 MG/ML-% IV SOSY
0.5000 mg | PREFILLED_SYRINGE | INTRAVENOUS | Status: DC | PRN
Start: 2016-09-25 — End: 2016-09-26
  Filled 2016-09-25: qty 1

## 2016-09-25 MED ORDER — SUCCINYLCHOLINE CHLORIDE 200 MG/10ML IV SOSY
PREFILLED_SYRINGE | INTRAVENOUS | Status: DC | PRN
  Administered 2016-09-25: 120 mg via INTRAVENOUS

## 2016-09-25 MED ORDER — ONDANSETRON HCL 4 MG/2ML IJ SOLN
INTRAMUSCULAR | Status: AC
  Filled 2016-09-25: qty 2

## 2016-09-25 MED ORDER — ONDANSETRON 4 MG PO TBDP: 4.0000 mg | ORAL_TABLET | Freq: Four times a day (QID) | ORAL | Status: DC | PRN

## 2016-09-25 MED ORDER — KCL IN DEXTROSE-NACL 20-5-0.45 MEQ/L-%-% IV SOLN
INTRAVENOUS | Status: DC
  Administered 2016-09-25 – 2016-09-26 (×2): via INTRAVENOUS
  Filled 2016-09-25 (×2): qty 1000

## 2016-09-25 MED ORDER — OXYCODONE HCL 5 MG PO TABS: 5.0000 mg | ORAL_TABLET | Freq: Once | ORAL | Status: DC | PRN

## 2016-09-25 MED ORDER — DEXAMETHASONE SODIUM PHOSPHATE 10 MG/ML IJ SOLN
INTRAMUSCULAR | Status: AC
  Filled 2016-09-25: qty 1

## 2016-09-25 MED ORDER — PANTOPRAZOLE SODIUM 40 MG IV SOLR
40.0000 mg | Freq: Every day | INTRAVENOUS | Status: DC
  Administered 2016-09-25: 40 mg via INTRAVENOUS
  Filled 2016-09-25: qty 40

## 2016-09-25 MED ORDER — PIPERACILLIN-TAZOBACTAM 3.375 G IVPB
3.3750 g | Freq: Three times a day (TID) | INTRAVENOUS | Status: DC
  Administered 2016-09-25 – 2016-09-26 (×3): 3.375 g via INTRAVENOUS
  Filled 2016-09-25 (×2): qty 50

## 2016-09-25 MED ORDER — CHLORHEXIDINE GLUCONATE CLOTH 2 % EX PADS: 6.0000 | MEDICATED_PAD | Freq: Every day | CUTANEOUS | Status: DC

## 2016-09-25 MED ORDER — HEPARIN SODIUM (PORCINE) 5000 UNIT/ML IJ SOLN
5000.0000 [IU] | Freq: Three times a day (TID) | INTRAMUSCULAR | Status: DC
  Administered 2016-09-25 – 2016-09-26 (×2): 5000 [IU] via SUBCUTANEOUS
  Filled 2016-09-25 (×2): qty 1

## 2016-09-25 MED ORDER — HYDROMORPHONE HCL-NACL 0.5-0.9 MG/ML-% IV SOSY
0.2500 mg | PREFILLED_SYRINGE | INTRAVENOUS | Status: DC | PRN
  Administered 2016-09-25 (×4): 0.5 mg via INTRAVENOUS

## 2016-09-25 MED ORDER — HYDROCODONE-ACETAMINOPHEN 5-325 MG PO TABS
1.0000 | ORAL_TABLET | ORAL | Status: DC | PRN
  Administered 2016-09-25 – 2016-09-26 (×2): 1 via ORAL
  Filled 2016-09-25 (×2): qty 1

## 2016-09-25 MED ORDER — IOPAMIDOL (ISOVUE-300) INJECTION 61%
INTRAVENOUS | Status: AC
Start: 2016-09-25 — End: 2016-09-25
  Filled 2016-09-25: qty 50

## 2016-09-25 MED ORDER — BUPIVACAINE HCL 0.25 % IJ SOLN
INTRAMUSCULAR | Status: DC | PRN
  Administered 2016-09-25: 50 mL

## 2016-09-25 MED ORDER — PROPOFOL 10 MG/ML IV BOLUS
INTRAVENOUS | Status: DC | PRN
  Administered 2016-09-25: 200 mg via INTRAVENOUS

## 2016-09-25 MED ORDER — PROPOFOL 10 MG/ML IV BOLUS
INTRAVENOUS | Status: AC
  Filled 2016-09-25: qty 20

## 2016-09-25 MED ORDER — 0.9 % SODIUM CHLORIDE (POUR BTL) OPTIME
TOPICAL | Status: DC | PRN
  Administered 2016-09-25: 1000 mL

## 2016-09-25 MED ORDER — ONDANSETRON HCL 4 MG/2ML IJ SOLN
4.0000 mg | Freq: Four times a day (QID) | INTRAMUSCULAR | Status: AC | PRN
  Administered 2016-09-25: 4 mg via INTRAVENOUS

## 2016-09-25 MED ORDER — BUPIVACAINE HCL 0.25 % IJ SOLN
INTRAMUSCULAR | Status: AC
  Filled 2016-09-25: qty 1

## 2016-09-25 MED ORDER — LACTATED RINGERS IR SOLN
Status: DC | PRN
  Administered 2016-09-25: 1000 mL

## 2016-09-25 MED ORDER — SUGAMMADEX SODIUM 200 MG/2ML IV SOLN
INTRAVENOUS | Status: DC | PRN
  Administered 2016-09-25: 200 mg via INTRAVENOUS

## 2016-09-25 MED ORDER — DEXAMETHASONE SODIUM PHOSPHATE 10 MG/ML IJ SOLN
INTRAMUSCULAR | Status: DC | PRN
  Administered 2016-09-25: 10 mg via INTRAVENOUS

## 2016-09-25 MED ORDER — LACTATED RINGERS IV SOLN
INTRAVENOUS | Status: DC | PRN
  Administered 2016-09-25 (×2): via INTRAVENOUS

## 2016-09-25 MED ORDER — SUCCINYLCHOLINE CHLORIDE 200 MG/10ML IV SOSY
PREFILLED_SYRINGE | INTRAVENOUS | Status: AC
  Filled 2016-09-25: qty 10

## 2016-09-25 MED ORDER — LACTATED RINGERS IV SOLN: INTRAVENOUS | Status: DC

## 2016-09-25 MED ORDER — LIDOCAINE 2% (20 MG/ML) 5 ML SYRINGE
INTRAMUSCULAR | Status: AC
  Filled 2016-09-25: qty 5

## 2016-09-25 MED ORDER — ROCURONIUM BROMIDE 50 MG/5ML IV SOSY
PREFILLED_SYRINGE | INTRAVENOUS | Status: DC | PRN
  Administered 2016-09-25: 10 mg via INTRAVENOUS
  Administered 2016-09-25: 40 mg via INTRAVENOUS
  Administered 2016-09-25: 10 mg via INTRAVENOUS

## 2016-09-25 MED ORDER — MIDAZOLAM HCL 5 MG/5ML IJ SOLN
INTRAMUSCULAR | Status: DC | PRN
  Administered 2016-09-25: 2 mg via INTRAVENOUS

## 2016-09-25 MED ORDER — ONDANSETRON HCL 4 MG/2ML IJ SOLN
INTRAMUSCULAR | Status: DC | PRN
  Administered 2016-09-25: 4 mg via INTRAVENOUS

## 2016-09-25 SURGICAL SUPPLY — 36 items
APPLICATOR COTTON TIP 6IN STRL (MISCELLANEOUS) IMPLANT
APPLIER CLIP ROT 10 11.4 M/L (STAPLE) ×3
BENZOIN TINCTURE PRP APPL 2/3 (GAUZE/BANDAGES/DRESSINGS) IMPLANT
CABLE HIGH FREQUENCY MONO STRZ (ELECTRODE) ×3 IMPLANT
CATH REDDICK CHOLANGI 4FR 50CM (CATHETERS) ×3 IMPLANT
CLIP APPLIE ROT 10 11.4 M/L (STAPLE) ×1 IMPLANT
CLOSURE WOUND 1/2 X4 (GAUZE/BANDAGES/DRESSINGS)
COVER MAYO STAND STRL (DRAPES) ×3 IMPLANT
COVER SURGICAL LIGHT HANDLE (MISCELLANEOUS) ×3 IMPLANT
DECANTER SPIKE VIAL GLASS SM (MISCELLANEOUS) IMPLANT
DERMABOND ADVANCED (GAUZE/BANDAGES/DRESSINGS) ×2
DERMABOND ADVANCED .7 DNX12 (GAUZE/BANDAGES/DRESSINGS) ×1 IMPLANT
DRAPE C-ARM 42X120 X-RAY (DRAPES) ×3 IMPLANT
ELECT PENCIL ROCKER SW 15FT (MISCELLANEOUS) IMPLANT
ELECT REM PT RETURN 15FT ADLT (MISCELLANEOUS) ×3 IMPLANT
GLOVE BIOGEL M 8.0 STRL (GLOVE) ×3 IMPLANT
GOWN STRL REUS W/TWL XL LVL3 (GOWN DISPOSABLE) ×9 IMPLANT
HEMOSTAT SURGICEL 4X8 (HEMOSTASIS) IMPLANT
IV CATH 14GX2 1/4 (CATHETERS) ×3 IMPLANT
KIT BASIN OR (CUSTOM PROCEDURE TRAY) ×3 IMPLANT
L-HOOK LAP DISP 36CM (ELECTROSURGICAL)
LHOOK LAP DISP 36CM (ELECTROSURGICAL) IMPLANT
POUCH RETRIEVAL ECOSAC 10 (ENDOMECHANICALS) ×1 IMPLANT
POUCH RETRIEVAL ECOSAC 10MM (ENDOMECHANICALS) ×2
SCISSORS LAP 5X45 EPIX DISP (ENDOMECHANICALS) ×3 IMPLANT
SET IRRIG TUBING LAPAROSCOPIC (IRRIGATION / IRRIGATOR) ×3 IMPLANT
SLEEVE XCEL OPT CAN 5 100 (ENDOMECHANICALS) ×3 IMPLANT
STRIP CLOSURE SKIN 1/2X4 (GAUZE/BANDAGES/DRESSINGS) IMPLANT
SUT MNCRL AB 4-0 PS2 18 (SUTURE) ×3 IMPLANT
SYR 20CC LL (SYRINGE) ×3 IMPLANT
TOWEL OR 17X26 10 PK STRL BLUE (TOWEL DISPOSABLE) ×3 IMPLANT
TRAY LAPAROSCOPIC (CUSTOM PROCEDURE TRAY) ×3 IMPLANT
TROCAR BLADELESS OPT 5 100 (ENDOMECHANICALS) ×3 IMPLANT
TROCAR XCEL BLUNT TIP 100MML (ENDOMECHANICALS) IMPLANT
TROCAR XCEL NON-BLD 11X100MML (ENDOMECHANICALS) ×3 IMPLANT
TUBING INSUF HEATED (TUBING) ×3 IMPLANT

## 2016-09-25 NOTE — Op Note (Signed)
Abel Hageman   09/25/2016  10:00 AM  Procedure: Laparoscopic Cholecystectomy with intraoperative cholangiogram  Surgeon: Catalina Antigua B. Hassell Done, MD, FACS Asst:  none  Anes:  General  Drains:  None  Findings: Acute and subacute cholecystitis with normal IOC and free flow into the duodenum  Description of Procedure: The patient was taken to OR 1 and given general anesthesia.  The patient was prepped with Technicare and draped sterilely. A time out was performed.  Access to the abdomen was achieved with a 5 mm Optiview through the left upper quadrant.  Port placement included three 5 mm trocars and one 11 mm trocar in the upper midline.    The gallbladder was shrouded in omentum and stuck to the anterior abdominal wall.  This was peeled off the gallbladder and the gallbladder was visualized and the fundus was grasped and the gallbladder was elevated. Traction on the infundibulum allowed for successful demonstration of the critical view. Inflammatory changes were severe.  The cystic duct was identified and clipped up on the gallbladder and an incision was made in the cystic duct and the Reddick catheter was inserted after milking the cystic duct of any debris. A dynamic cholangiogram was performed which demonstrated intrahepatic filling and free flow into the duodenum.    The cystic duct was then triple clipped and divided, the cystic artery was double clipped and divided and then the gallbladder was removed from the gallbladder bed. Removal of the gallbladder from the gallbladder bed was difficult due to the edema and inflammation.  The gallbladder was then placed in a bag and brought out through the upper midline trocar site which had to be enlarged.  This incision which was oblique was closed with 2 sutures of 0 vicryl.   The gallbladder bed was inspected and no bleeding or bile leaks were seen.      Incisions were injected with Marcaine and closed with 4-0 Monocryl and Dermabond on the skin.  Sponge and  needle count were correct.  The gallbladder contained a stone that was ~ 1 inch in diameter.    The patient was taken to the recovery room in satisfactory condition.

## 2016-09-25 NOTE — Anesthesia Preprocedure Evaluation (Signed)
Anesthesia Evaluation  Patient identified by MRN, date of birth, ID band Patient awake    Reviewed: Allergy & Precautions, H&P , NPO status , Patient's Chart, lab work & pertinent test results  Airway Mallampati: II   Neck ROM: full    Dental   Pulmonary former smoker,    breath sounds clear to auscultation       Cardiovascular negative cardio ROS   Rhythm:regular Rate:Normal     Neuro/Psych    GI/Hepatic gallstones   Endo/Other    Renal/GU      Musculoskeletal  (+) Arthritis ,   Abdominal   Peds  Hematology   Anesthesia Other Findings   Reproductive/Obstetrics                             Anesthesia Physical Anesthesia Plan  ASA: II  Anesthesia Plan: General   Post-op Pain Management:    Induction: Intravenous  PONV Risk Score and Plan: 2 and Ondansetron, Dexamethasone and Treatment may vary due to age or medical condition  Airway Management Planned: Oral ETT  Additional Equipment:   Intra-op Plan:   Post-operative Plan: Extubation in OR  Informed Consent: I have reviewed the patients History and Physical, chart, labs and discussed the procedure including the risks, benefits and alternatives for the proposed anesthesia with the patient or authorized representative who has indicated his/her understanding and acceptance.     Plan Discussed with: CRNA, Anesthesiologist and Surgeon  Anesthesia Plan Comments:         Anesthesia Quick Evaluation

## 2016-09-25 NOTE — Interval H&P Note (Signed)
History and Physical Interval Note:  09/25/2016 7:41 AM  Alvin [name]  has presented today for surgery, with the diagnosis of gallstones  The various methods of treatment have been discussed with the patient and family. After consideration of risks, benefits and other options for treatment, the patient has consented to  Procedure(s): LAPAROSCOPIC CHOLECYSTECTOMY WITH INTRAOPERATIVE CHOLANGIOGRAM (N/A) as a surgical intervention .  The patient's history has been reviewed, patient examined, no change in status, stable for surgery.  I have reviewed the patient's chart and labs.  Questions were answered to the patient's satisfaction.     Belina Mandile B

## 2016-09-25 NOTE — Anesthesia Postprocedure Evaluation (Signed)
Anesthesia Post Note  Patient: Arjay Jaskiewicz  Procedure(s) Performed: Procedure(s) (LRB): LAPAROSCOPIC CHOLECYSTECTOMY WITH INTRAOPERATIVE CHOLANGIOGRAM (N/A)     Patient location during evaluation: PACU Anesthesia Type: General Level of consciousness: awake and alert Pain management: pain level controlled Vital Signs Assessment: post-procedure vital signs reviewed and stable Respiratory status: spontaneous breathing, nonlabored ventilation, respiratory function stable and patient connected to nasal cannula oxygen Cardiovascular status: blood pressure returned to baseline and stable Postop Assessment: no signs of nausea or vomiting Anesthetic complications: no    Last Vitals:  Vitals:   09/25/16 1030 09/25/16 1045  BP: (!) 151/75 140/78  Pulse: 78 76  Resp: 20 16  Temp:  36.7 C  SpO2: 94% 95%    Last Pain:  Vitals:   09/25/16 1045  TempSrc:   PainSc: [place]

## 2016-09-25 NOTE — Anesthesia Procedure Notes (Signed)
Procedure Name: Intubation Date/Time: 09/25/2016 7:55 AM Performed by: Montel Clock Pre-anesthesia Checklist: Patient identified, Emergency Drugs available, Suction available, Patient being monitored and Timeout performed Patient Re-evaluated:Patient Re-evaluated prior to induction Oxygen Delivery Method: Circle system utilized Preoxygenation: Pre-oxygenation with 100% oxygen Induction Type: IV induction Ventilation: Mask ventilation without difficulty Laryngoscope Size: Mac and 3 Grade View: Grade I Tube type: Oral Tube size: 7.5 mm Number of attempts: 1 Airway Equipment and Method: Stylet Placement Confirmation: ETT inserted through vocal cords under direct vision,  positive ETCO2 and breath sounds checked- equal and bilateral Secured at: 23 cm Tube secured with: Tape Dental Injury: Teeth and Oropharynx as per pre-operative assessment  Comments: Intubation by paramedic student

## 2016-09-25 NOTE — Progress Notes (Signed)
Notified pharmacy that early  5 am dose of Zosyn not completed prior to pt leaving for sx. Pharmacy to readjust schedule.

## 2016-09-25 NOTE — Transfer of Care (Signed)
Immediate Anesthesia Transfer of Care Note  Patient: Alvin [name]  Procedure(s) Performed: Procedure(s): LAPAROSCOPIC CHOLECYSTECTOMY WITH INTRAOPERATIVE CHOLANGIOGRAM (N/A)  Patient Location: PACU  Anesthesia Type:General  Level of Consciousness:  sedated, patient cooperative and responds to stimulation  Airway & Oxygen Therapy:Patient Spontanous Breathing and Patient connected to face mask oxgen  Post-op Assessment:  Report given to PACU RN and Post -op Vital signs reviewed and stable  Post vital signs:  Reviewed and stable  Last Vitals:  Vitals:   09/25/16 0139 09/25/16 0513  BP: 139/76 (!) 165/84  Pulse: 70 66  Resp: 16 16  Temp: 37.1 C 37.1 C  SpO2: 09% 29%    Complications: No apparent anesthesia complications

## 2016-09-25 NOTE — Progress Notes (Addendum)
Paged Dr Barry Dienes for advance in patients diet. Pt is tolerating clears, ambulating, voiding, passing gas, and only has mild pain. Received verbal order for adv as tol.

## 2016-09-26 DIAGNOSIS — K8000 Calculus of gallbladder with acute cholecystitis without obstruction: Secondary | ICD-10-CM | POA: Diagnosis not present

## 2016-09-26 DIAGNOSIS — K810 Acute cholecystitis: Secondary | ICD-10-CM | POA: Diagnosis not present

## 2016-09-26 LAB — CBC
HEMATOCRIT: 37.9 % — AB (ref 39.0–52.0)
Hemoglobin: 12.8 g/dL — ABNORMAL LOW (ref 13.0–17.0)
MCH: 30.8 pg (ref 26.0–34.0)
MCHC: 33.8 g/dL (ref 30.0–36.0)
MCV: 91.1 fL (ref 78.0–100.0)
PLATELETS: 490 10*3/uL — AB (ref 150–400)
RBC: 4.16 MIL/uL — ABNORMAL LOW (ref 4.22–5.81)
RDW: 12.1 % (ref 11.5–15.5)
WBC: 14.9 10*3/uL — AB (ref 4.0–10.5)

## 2016-09-26 LAB — COMPREHENSIVE METABOLIC PANEL
ALT: 53 U/L (ref 17–63)
AST: 47 U/L — AB (ref 15–41)
Albumin: 3.8 g/dL (ref 3.5–5.0)
Alkaline Phosphatase: 85 U/L (ref 38–126)
Anion gap: 8 (ref 5–15)
BILIRUBIN TOTAL: 0.7 mg/dL (ref 0.3–1.2)
BUN: 9 mg/dL (ref 6–20)
CALCIUM: 10.9 mg/dL — AB (ref 8.9–10.3)
CO2: 26 mmol/L (ref 22–32)
CREATININE: 0.89 mg/dL (ref 0.61–1.24)
Chloride: 104 mmol/L (ref 101–111)
GFR calc Af Amer: >60 mL/min (ref >60)
Glucose, Bld: 125 mg/dL — ABNORMAL HIGH (ref 65–99)
POTASSIUM: 4.5 mmol/L (ref 3.5–5.1)
Sodium: 138 mmol/L (ref 135–145)
TOTAL PROTEIN: 7.5 g/dL (ref 6.5–8.1)

## 2016-09-26 MED ORDER — HYDROCODONE-ACETAMINOPHEN 5-325 MG PO TABS: 1.0000 | ORAL_TABLET | ORAL | 0 refills | Status: DC | PRN

## 2016-09-26 NOTE — Discharge Summary (Signed)
[facility] Discharge Summary   Patient ID: Alvin [name] MRN: [id number] DOB/AGE: [birth year] 57 y.o.  Admit date: 09/24/2016 Discharge date: 09/26/2016 Discharge Diagnosis Patient Active Problem List   Diagnosis Date Noted  . Acute cholecystitis 09/24/2016  . Lipoma of thigh 12/27/2014   Imaging: Dg Cholangiogram Operative  Result Date: 09/25/2016 CLINICAL DATA:  56 year old male with a history of right upper quadrant pain and cholelithiasis EXAM: INTRAOPERATIVE CHOLANGIOGRAM TECHNIQUE: Cholangiographic images from the C-arm fluoroscopic device were submitted for interpretation post-operatively. Please see the procedural report for the amount of contrast and the fluoroscopy time utilized. COMPARISON:  Ultrasound 09/24/2016 FINDINGS: Surgical instruments project over the upper abdomen. There is cannulation of the cystic duct/gallbladder neck, with antegrade infusion of contrast. Caliber of the extrahepatic ductal system within normal limits. No large filling defect identified. Free flow of contrast across the ampulla. IMPRESSION: Intraoperative cholangiogram demonstrates extrahepatic biliary ducts of unremarkable caliber, with no large filling defect identified. Free flow of contrast across the ampulla. Please refer to the dictated operative report for full details of intraoperative findings and procedure Electronically Signed   By: Corrie Mckusick D.O.   On: 09/25/2016 09:24   US Abdomen Limited Ruq  Result Date: 09/24/2016 CLINICAL DATA:  Vomiting, fever, upper abdominal pain. EXAM: ULTRASOUND ABDOMEN LIMITED RIGHT UPPER QUADRANT COMPARISON:  04/20/2014 CT FINDINGS: Gallbladder: Multiple small stones, the largest 11 mm. There appears to be a small non mobile stone in the gallbladder neck. Gallbladder wall is thickened at 4 mm. Small amount of pericholecystic fluid. Common bile duct: Diameter: Normal diameter, 3 mm. Liver: No focal lesion identified. Within normal limits in  parenchymal echogenicity. Portal vein is patent on color Doppler imaging with normal direction of blood flow towards the liver. IMPRESSION: Cholelithiasis. There is mild gallbladder wall thickening and pericholecystic fluid. Findings concerning for early acute cholecystitis. Electronically Signed   By: Rolm Baptise M.D.   On: 09/24/2016 14:59    Procedures Dr. Johnathan Hausen (09/26/15) - Laparoscopic Cholecystectomy with [place] Hospital Course:  56 y.o. male who presented to [facility] from [facility] with 5d abdominal pain.  Workup significant for calculous cholecystitis.  Patient was admitted and underwent procedure listed above. Found to have a significantly inflamed gallbladder intraoperatively  But IOC negative for choledocholithiasis/filling defect. Tolerated procedure well and was transferred to the floor.  Diet was advanced as tolerated.  On POD#1, the patient was voiding well, tolerating diet, ambulating well, pain well controlled, vital signs stable, incisions c/d/i and felt stable for discharge home.  Patient will follow up in our office in 2 weeks and knows to call with questions or concerns.     Physical Exam: General:  Alert, NAD, pleasant, comfortable Abd:  Soft, ND, mild tenderness, incisions C/D/I  Allergies as of 09/26/2016   No Known Allergies     Medication List    TAKE these medications   acetaminophen 325 MG tablet Commonly known as:  TYLENOL Take 650 mg by mouth every 6 (six) hours as needed for fever.   HYDROcodone-acetaminophen 5-325 MG tablet Commonly known as:  NORCO/VICODIN Take 1 tablet by mouth every 4 (four) hours as needed for moderate pain.   omeprazole 40 MG capsule Commonly known as:  PRILOSEC Take 40 mg by mouth daily.            Discharge Care Instructions        Start     Ordered   09/26/16 0000  HYDROcodone-acetaminophen (NORCO/VICODIN) 5-325 MG tablet  Every 4 hours  PRN    Question:  Supervising Provider  Answer:  Johnathan Hausen   09/26/16 [id number]    09/26/16 0000  Discharge patient    Question Answer Comment  Discharge disposition 01-Home or Self Care   Discharge patient date 09/26/2016      09/26/16 0907       Follow-up [facility]. Go on 10/09/2016.   Specialty:  General Surgery Why:  at 11:45 AM for post-operative follow up. please arrive 30 minutes early. Contact information: [address] [place] [phone]          Signed: Obie Dredge, [facility] 09/26/2016, 11:15 AM Pager: [phone] Consults: [phone] Mon-Fri 7:00 am-4:30 pm Sat-Sun 7:00 am-11:30 am

## 2016-09-26 NOTE — Discharge Instructions (Signed)
please arrive at least 30 min before your appointment to complete your check in paperwork.  If you are unable to arrive 30 min prior to your appointment time we may have to cancel or reschedule you.  LAPAROSCOPIC SURGERY: POST OP INSTRUCTIONS  1. DIET: Follow a light bland diet the first 24 hours after arrival home, such as soup, liquids, crackers, etc. Be sure to include lots of fluids daily. Avoid fast food or heavy meals as your are more likely to get nauseated. Eat a low fat the next few days after surgery.  2. Take your usually prescribed home medications unless otherwise directed. 3. PAIN CONTROL:  1. Pain is best controlled by a usual combination of three different methods TOGETHER:  1. Ice/Heat 2. Over the counter pain medication 3. Prescription pain medication 2. Most patients will experience some swelling and bruising around the incisions. Ice packs or heating pads (30-60 minutes up to 6 times a day) will help. Use ice for the first few days to help decrease swelling and bruising, then switch to heat to help relax tight/sore spots and speed recovery. Some people prefer to use ice alone, heat alone, alternating between ice & heat. Experiment to what works for you. Swelling and bruising can take several [place] to resolve.  3. It is helpful to take an over-the-counter pain medication regularly for the first few [place]. Choose one of the following that works best for you:  1. Naproxen (Aleve, etc) Two 220mg  tabs twice a day 2. Ibuprofen (Advil, etc) Three 200mg  tabs four times a day (every meal & bedtime) 3. Acetaminophen (Tylenol, etc) 500-650mg  four times a day (every meal & bedtime) 4. A prescription for pain medication (such as oxycodone, hydrocodone, etc) should be given to you upon discharge. Take your pain medication as prescribed.  1. If you are having problems/concerns with the prescription medicine (does not control pain, nausea, vomiting, rash, itching, etc), please call us [phone]  [phone] to see if we need to switch you to a different pain medicine that will work better for you and/or control your side effect better. 2. If you need a refill on your pain medication, please contact your pharmacy. They will contact our office to request authorization. Prescriptions will not be filled after 5 pm or on week-ends. 4. Avoid getting constipated. Between the surgery and the pain medications, it is common to experience some constipation. Increasing fluid intake and taking a fiber supplement (such as Metamucil, Citrucel, FiberCon, MiraLax, etc) 1-2 times a day regularly will usually help prevent this problem from occurring. A mild laxative (prune juice, Milk of Magnesia, MiraLax, etc) should be taken according to package directions if there are no bowel movements after 48 hours.  5. Watch out for diarrhea. If you have many loose bowel movements, simplify your diet to bland foods & liquids for a few days. Stop any stool softeners and decrease your fiber supplement. Switching to mild anti-diarrheal medications (Kayopectate, Pepto Bismol) can help. If this worsens or does not improve, please call us. 6. Wash / shower every day. You may shower over the dressings as they are waterproof. Continue to shower over incision(s) after the dressing is off. 7. Remove your waterproof bandages 5 days after surgery. You may leave the incision open to air. You may replace a dressing/Band-Aid to cover the incision for comfort if you wish.  8. ACTIVITIES as tolerated:  1. You may resume regular (light) daily activities beginning the next day--such as daily self-care, walking, climbing stairs--gradually  increasing activities as tolerated. If you can walk 30 minutes without difficulty, it is safe to try more intense activity such as jogging, treadmill, bicycling, low-impact aerobics, swimming, etc. 2. Save the most intensive and strenuous activity for last such as sit-ups, heavy lifting, contact sports, etc Refrain  from any heavy lifting or straining until you are off narcotics for pain control.  3. DO NOT PUSH THROUGH PAIN. Let pain be your guide: If it hurts to do something, don't do it. Pain is your body warning you to avoid that activity for another week until the pain goes down. 4. You may drive when you are no longer taking prescription pain medication, you can comfortably wear a seatbelt, and you can safely maneuver your car and apply brakes. 5. You may have sexual intercourse when it is comfortable.  9. FOLLOW UP in our office  1. Please call [facility] at [phone] to set up an appointment to see your surgeon in the office for a follow-up appointment approximately 2-3 weeks after your surgery. 2. Make sure that you call for this appointment the day you arrive home to insure a convenient appointment time.      10. IF YOU HAVE DISABILITY OR FAMILY LEAVE FORMS, BRING THEM TO THE               OFFICE FOR PROCESSING.   WHEN TO CALL us [phone]:  1. Poor pain control 2. Reactions / problems with new medications (rash/itching, nausea, etc)  3. Fever over 101.5 F (38.5 C) 4. Inability to urinate 5. Nausea and/or vomiting 6. Worsening swelling or bruising 7. Continued bleeding from incision. 8. Increased pain, redness, or drainage from the incision  The clinic staff is available to answer your questions during regular business hours (8:30am-5pm). Please dont hesitate to call and ask to speak to one of our nurses for clinical concerns.  If you have a medical emergency, go to the nearest emergency room or call 911.  A surgeon from [facility] is always on call at the [facility], [place], [place], [place], [place] [address] ?  MAIN: [phone] ? TOLL FREE: [phone] ?  FAX [phone]  [email or web address]  CIRUGIA LAPAROSCOPICA: INSTRUCCIONES DE POST OPERATORIO.  Revise siempre los documentos que le entreguen en el  lugar donde se ha hecho la [place].  SI USTED NECESITA DOCUMENTOS DE INCAPACIDAD (DISABLE) O DE PERMISO FAMILAR (FAMILY LEAVE) NECESITA TRAERLOS A LA OFICINA PARA QUE SEAN PROCESADOS. NO  SE LOS DE A SU DOCTOR. 1. A su alta del hospital se le dara una receta para Financial controller. Tomela como ha sido recetada, si la necesita. Si no la necesita puede tomar, Acetaminofen (Tylenol) o Ibuprofen (Advil) para aliviar dolor moderado. 2. Continue tomando el resto de sus medicinas. 3. Si necesita rellenar la receta, llame a la farmacia. ellos contactan a nuestra oficina pidiendo autorizacion. Este tipo de receta no pueden ser [employer] de las  5pm o [employer] fines de [place]. 4. Con relacion a la dieta: debe ser [employer] primeros dias despues que llege a la casa. Ejemplo: sopas y galleticas. Tome bastante liquido esos dias. 5. La mayoria de los pacientes padecen de inflamacion y cambio de coloracion de la piel alrededor de las incisiones. esto toma dias en resolver.  pnerse una bolsa de hielo en el area affectada ayuda..  6. Es comun tambien tener un poco de estrenimiento si esta tomado medicinas  para Conservation officer, historic buildings. incremente la cantidad de liquidos a tomar y Doctor, hospital (Colace) esto previene el problema. Si ya tiene estrenimiento, es Software engineer no ha defecado en 48 horas, puede tomar un laxativo (Milk of Magnesia or Miralax) uselo como el paquete le explica. 7.  A menos que se le diga algo diferente. Remueva el bendaje a las 24-48 horas despues dela [place]. y puede banarse en la ducha sin ningun problema. usted puede tener steri-strips (pequenas curitas transparentes en la piel puesta encima de la incision)  Estas banditas strips should be left on the skin for 7-10 days.   Si su cirujano puso pegamento encima de la incision usted puede banarse bajo la ducha en 24 horas. Este pegamento empezara a caerse en las proximas 2-3 semanas. Si le pusieron suturas o presillas (grapos) estos seran quitados en su  proxima cita en la oficina. [name] a. ACTIVIDADES:  Puede hacer actividad ligera.  Como caminar , subir escaleras y poco a poco irlas incrementando tanto como las [place]. Puede tener relaciones sexuales cuando sea comfortable. No carge objetos pesados o haga esfuerzos que no sean aprovados por su doctor. b. Puede manejar en cuanto no esta tomando medicamentos fuertes (narcoticos) para Conservation officer, historic buildings, pueda abrochar confortablemente el cinturon de seguridad, y pueda Psychologist, counselling y usar los pedales de su vehiculo con seguridad. c. [place]  8. Debe ver a su doctor para una cita de seguimiento en 2-3 semanas despues de la [place].  9. OTRAS ISNSTRUCCIONES:___________________________________________________________________________________ Angelina Pih A SU MEDICO: 1. FIEBRE mayor de  101.0 2. No produccion de [place]. 3. Sangramiento continue de la herida 4. Incremento de dolor, enrojecimientio o drenaje de la herida (incision) 5. Incremento de dolor abdominal.  The clinic staff is available to answer your questions during regular business hours.  Please dont hesitate to call and ask to speak to one of the nurses for clinical concerns.  If you have a medical emergency, go to the nearest emergency room or call 911.  A surgeon from [facility] is always on call at the hospital. [address] [place], [place], [place]  [address] ? [address] [place], [place], [place]   [phone] ? [phone] ? FAX [phone] Web site: [email or web address]

## 2019-12-03 ENCOUNTER — Emergency Department (HOSPITAL_COMMUNITY): Payer: Medicare Other

## 2019-12-03 ENCOUNTER — Other Ambulatory Visit: Payer: Self-pay | Admitting: Gastroenterology

## 2019-12-03 ENCOUNTER — Ambulatory Visit
Admission: RE | Admit: 2019-12-03 | Discharge: 2019-12-03 | Disposition: A | Discharge status: Home / Self Care | Payer: Medicare Other | Source: Ambulatory Visit | Attending: Gastroenterology | Admitting: Gastroenterology

## 2019-12-03 ENCOUNTER — Inpatient Hospital Stay (HOSPITAL_COMMUNITY): Payer: Medicare Other

## 2019-12-03 ENCOUNTER — Other Ambulatory Visit: Payer: Self-pay

## 2019-12-03 ENCOUNTER — Encounter (HOSPITAL_COMMUNITY): Payer: Self-pay | Admitting: Emergency Medicine

## 2019-12-03 ENCOUNTER — Inpatient Hospital Stay (HOSPITAL_COMMUNITY)
Admission: EM | Admit: 2019-12-03 | Discharge: 2019-12-05 | DRG: 390 | Disposition: A | Discharge status: Home / Self Care | Payer: Medicare Other | Attending: Internal Medicine | Admitting: Internal Medicine

## 2019-12-03 DIAGNOSIS — R112 Nausea with vomiting, unspecified: Secondary | ICD-10-CM

## 2019-12-03 DIAGNOSIS — E876 Hypokalemia: Secondary | ICD-10-CM | POA: Diagnosis present

## 2019-12-03 DIAGNOSIS — R109 Unspecified abdominal pain: Secondary | ICD-10-CM

## 2019-12-03 DIAGNOSIS — M1990 Unspecified osteoarthritis, unspecified site: Secondary | ICD-10-CM | POA: Diagnosis present

## 2019-12-03 DIAGNOSIS — Z79899 Other long term (current) drug therapy: Secondary | ICD-10-CM

## 2019-12-03 DIAGNOSIS — Z20822 Contact with and (suspected) exposure to covid-19: Secondary | ICD-10-CM | POA: Diagnosis present

## 2019-12-03 DIAGNOSIS — Z0189 Encounter for other specified special examinations: Secondary | ICD-10-CM

## 2019-12-03 DIAGNOSIS — K56609 Unspecified intestinal obstruction, unspecified as to partial versus complete obstruction: Principal | ICD-10-CM | POA: Diagnosis present

## 2019-12-03 DIAGNOSIS — K5730 Diverticulosis of large intestine without perforation or abscess without bleeding: Secondary | ICD-10-CM | POA: Diagnosis present

## 2019-12-03 DIAGNOSIS — M545 Low back pain, unspecified: Secondary | ICD-10-CM | POA: Diagnosis present

## 2019-12-03 DIAGNOSIS — G8929 Other chronic pain: Secondary | ICD-10-CM | POA: Diagnosis present

## 2019-12-03 DIAGNOSIS — R739 Hyperglycemia, unspecified: Secondary | ICD-10-CM | POA: Diagnosis present

## 2019-12-03 DIAGNOSIS — Z87891 Personal history of nicotine dependence: Secondary | ICD-10-CM

## 2019-12-03 DIAGNOSIS — I10 Essential (primary) hypertension: Secondary | ICD-10-CM | POA: Diagnosis present

## 2019-12-03 HISTORY — DX: Essential (primary) hypertension: I10

## 2019-12-03 LAB — URINALYSIS, ROUTINE W REFLEX MICROSCOPIC
Glucose, UA: NEGATIVE mg/dL
Hgb urine dipstick: NEGATIVE
Ketones, ur: 15 mg/dL — AB
Leukocytes,Ua: NEGATIVE
Nitrite: NEGATIVE
Protein, ur: NEGATIVE mg/dL
Specific Gravity, Urine: >1.03 — ABNORMAL HIGH (ref 1.005–1.030)
pH: 6 (ref 5.0–8.0)

## 2019-12-03 LAB — COMPREHENSIVE METABOLIC PANEL
ALT: 25 U/L (ref 0–44)
AST: 23 U/L (ref 15–41)
Albumin: 4.3 g/dL (ref 3.5–5.0)
Alkaline Phosphatase: 82 U/L (ref 38–126)
Anion gap: 14 (ref 5–15)
BUN: 28 mg/dL — ABNORMAL HIGH (ref 6–20)
CO2: 24 mmol/L (ref 22–32)
Calcium: 11.1 mg/dL — ABNORMAL HIGH (ref 8.9–10.3)
Chloride: 95 mmol/L — ABNORMAL LOW (ref 98–111)
Creatinine, Ser: 1.11 mg/dL (ref 0.61–1.24)
GFR, Estimated: >60 mL/min (ref >60)
Glucose, Bld: 133 mg/dL — ABNORMAL HIGH (ref 70–99)
Potassium: 3.2 mmol/L — ABNORMAL LOW (ref 3.5–5.1)
Sodium: 133 mmol/L — ABNORMAL LOW (ref 135–145)
Total Bilirubin: 1.2 mg/dL (ref 0.3–1.2)
Total Protein: 8.1 g/dL (ref 6.5–8.1)

## 2019-12-03 LAB — CBC
HCT: 48.5 % (ref 39.0–52.0)
Hemoglobin: 16.5 g/dL (ref 13.0–17.0)
MCH: 30.2 pg (ref 26.0–34.0)
MCHC: 34 g/dL (ref 30.0–36.0)
MCV: 88.8 fL (ref 80.0–100.0)
Platelets: 405 10*3/uL — ABNORMAL HIGH (ref 150–400)
RBC: 5.46 MIL/uL (ref 4.22–5.81)
RDW: 11.9 % (ref 11.5–15.5)
WBC: 5.4 10*3/uL (ref 4.0–10.5)
nRBC: 0 % (ref 0.0–0.2)

## 2019-12-03 LAB — HIV ANTIBODY (ROUTINE TESTING W REFLEX): HIV Screen 4th Generation wRfx: NONREACTIVE

## 2019-12-03 LAB — RESPIRATORY PANEL BY RT PCR (FLU A&B, COVID)
Influenza A by PCR: NEGATIVE
Influenza B by PCR: NEGATIVE
SARS Coronavirus 2 by RT PCR: NEGATIVE

## 2019-12-03 LAB — LIPASE, BLOOD: Lipase: 21 U/L (ref 11–51)

## 2019-12-03 MED ORDER — DIATRIZOATE MEGLUMINE & SODIUM 66-10 % PO SOLN
90.0000 mL | Freq: Once | ORAL | Status: AC
  Administered 2019-12-04: 90 mL via NASOGASTRIC
  Filled 2019-12-03: qty 90

## 2019-12-03 MED ORDER — MORPHINE SULFATE (PF) 2 MG/ML IV SOLN
2.0000 mg | INTRAVENOUS | Status: DC | PRN
  Administered 2019-12-03 – 2019-12-04 (×3): 2 mg via INTRAVENOUS
  Filled 2019-12-03 (×2): qty 1

## 2019-12-03 MED ORDER — ONDANSETRON 4 MG PO TBDP: 4.0000 mg | ORAL_TABLET | Freq: Four times a day (QID) | ORAL | Status: DC | PRN

## 2019-12-03 MED ORDER — SODIUM CHLORIDE 0.9 % IV BOLUS
1000.0000 mL | Freq: Once | INTRAVENOUS | Status: AC
  Administered 2019-12-03: 1000 mL via INTRAVENOUS

## 2019-12-03 MED ORDER — ONDANSETRON HCL 4 MG/2ML IJ SOLN
4.0000 mg | Freq: Four times a day (QID) | INTRAMUSCULAR | Status: DC | PRN
  Administered 2019-12-03 – 2019-12-04 (×2): 4 mg via INTRAVENOUS
  Filled 2019-12-03 (×2): qty 2

## 2019-12-03 MED ORDER — ACETAMINOPHEN 650 MG RE SUPP: 650.0000 mg | Freq: Four times a day (QID) | RECTAL | Status: DC | PRN

## 2019-12-03 MED ORDER — MORPHINE SULFATE (PF) 4 MG/ML IV SOLN
4.0000 mg | Freq: Once | INTRAVENOUS | Status: AC
  Administered 2019-12-03: 4 mg via INTRAVENOUS
  Filled 2019-12-03: qty 1

## 2019-12-03 MED ORDER — ACETAMINOPHEN 325 MG PO TABS: 650.0000 mg | ORAL_TABLET | Freq: Four times a day (QID) | ORAL | Status: DC | PRN

## 2019-12-03 MED ORDER — LACTATED RINGERS IV SOLN: INTRAVENOUS | Status: DC

## 2019-12-03 MED ORDER — HYDRALAZINE HCL 20 MG/ML IJ SOLN: 10.0000 mg | INTRAMUSCULAR | Status: DC | PRN

## 2019-12-03 MED ORDER — IOHEXOL 300 MG/ML  SOLN
100.0000 mL | Freq: Once | INTRAMUSCULAR | Status: AC | PRN
  Administered 2019-12-03: 100 mL via INTRAVENOUS

## 2019-12-03 MED ORDER — ONDANSETRON HCL 4 MG/2ML IJ SOLN
4.0000 mg | Freq: Once | INTRAMUSCULAR | Status: AC
  Administered 2019-12-03: 4 mg via INTRAVENOUS
  Filled 2019-12-03: qty 2

## 2019-12-03 MED ORDER — PHENOL 1.4 % MT LIQD
1.0000 | OROMUCOSAL | Status: DC | PRN
  Administered 2019-12-03: 1 via OROMUCOSAL
  Filled 2019-12-03: qty 177

## 2019-12-03 NOTE — Consult Note (Signed)
Reason for Consult: SBO Referring Physician: Dr. Fidela Salisbury Mabry is an 59 y.o. [name].  HPI: Patient is a 59 year old [name], with a history of laparoscopic cholecystectomy, who comes in 2 days after a colonoscopy.  Patient states that he began having some abdominal distention, some abdominal pain.  He states is been associate with some nausea vomiting there as of today.  Also stated that there was some diarrhea however nonbloody.  Upon evaluation in the ER he underwent CT scan and laboratory studies. CT scan reveals SBO at the distal small bowel.  Patient had normal WBC count.  General surgery was consulted for further evaluation and management.    Past Medical History:  Diagnosis Date  . DJD (degenerative joint disease)   . Gallstones    scan 3 months ago per pt  . Lipoma    left thigh    Past Surgical History:  Procedure Laterality Date  . BACK SURGERY  2014   lower back, screws and rod  . CHOLECYSTECTOMY N/A 09/25/2016   Procedure: LAPAROSCOPIC CHOLECYSTECTOMY WITH INTRAOPERATIVE CHOLANGIOGRAM;  Surgeon: Johnathan Hausen, MD;  Location: [facility];  Service: General;  Laterality: N/A;  . LIPOMA EXCISION Left 12/27/2014   Procedure: EXCISION OF LEFT THIGH LIPOMA;  Surgeon: Paralee Cancel, MD;  Location: [facility];  Service: Orthopedics;  Laterality: Left;  . 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  Social History:  reports that he has quit smoking. His smoking use included cigarettes. He has never used smokeless tobacco. He reports current alcohol use. He reports that he does not use drugs.  Allergies: No Known Allergies  Medications: I have reviewed the patient's current medications.  Results for orders placed or performed during the hospital encounter of 12/03/19 (from the past 48 hour(s))  Urinalysis, Routine w reflex microscopic     Status: Abnormal   Collection Time: 12/03/19 12:32 PM  Result Value Ref Range   Color, Urine ORANGE (A)  YELLOW    Comment: BIOCHEMICALS MAY BE AFFECTED BY COLOR   APPearance HAZY (A) CLEAR   Specific Gravity, Urine >1.030 (H) 1.005 - 1.030   pH 6.0 5.0 - 8.0   Glucose, UA NEGATIVE NEGATIVE mg/dL   Hgb urine dipstick NEGATIVE NEGATIVE   Bilirubin Urine SMALL (A) NEGATIVE   Ketones, ur 15 (A) NEGATIVE mg/dL   Protein, ur NEGATIVE NEGATIVE mg/dL   Nitrite NEGATIVE NEGATIVE   Leukocytes,Ua NEGATIVE NEGATIVE    Comment: Microscopic not done on urines with negative protein, blood, leukocytes, nitrite, or glucose < 500 mg/dL. Performed at [facility], [place] [address], [place], [place] [address]   Lipase, blood     Status: None   Collection Time: 12/03/19 12:42 PM  Result Value Ref Range   Lipase 21 11 - 51 U/L    Comment: Performed at [facility], [place] [address] [place], [place] [address]  Comprehensive metabolic panel     Status: Abnormal   Collection Time: 12/03/19 12:42 PM  Result Value Ref Range   Sodium 133 (L) 135 - 145 mmol/L   Potassium 3.2 (L) 3.5 - 5.1 mmol/L   Chloride 95 (L) 98 - 111 mmol/L   CO2 24 22 - 32 mmol/L   Glucose, Bld 133 (H) 70 - 99 mg/dL    Comment: Glucose reference range applies only to samples taken after fasting for at least 8 hours.   BUN 28 (H) 6 - 20 mg/dL  Creatinine, Ser 1.11 0.61 - 1.24 mg/dL   Calcium 11.1 (H) 8.9 - 10.3 mg/dL   Total Protein 8.1 6.5 - 8.1 g/dL   Albumin 4.3 3.5 - 5.0 g/dL   AST 23 15 - 41 U/L   ALT 25 0 - 44 U/L   Alkaline Phosphatase 82 38 - 126 U/L   Total Bilirubin 1.2 0.3 - 1.2 mg/dL   GFR, Estimated >60 >60 mL/min    Comment: (NOTE) Calculated using the CKD-EPI Creatinine Equation (2021)    Anion gap 14 5 - 15    Comment: Performed at [facility] [address], [place], [place] [address]  CBC     Status: Abnormal   Collection Time: 12/03/19 12:42 PM  Result Value Ref Range   WBC 5.4 4.0 - 10.5 K/uL   RBC 5.46 4.22 - 5.81 MIL/uL   Hemoglobin 16.5 13.0 - 17.0 g/dL   HCT 48.5 39 - 52 %   MCV 88.8  80.0 - 100.0 fL   MCH 30.2 26.0 - 34.0 pg   MCHC 34.0 30.0 - 36.0 g/dL   RDW 11.9 11.5 - 15.5 %   Platelets 405 (H) 150 - 400 K/uL   nRBC 0.0 0.0 - 0.2 %    Comment: Performed at [facility] [address], [place],  [address]  Respiratory Panel by RT PCR (Flu A&B, Covid) - Nasopharyngeal Swab     Status: None   Collection Time: 12/03/19  1:17 PM   Specimen: Nasopharyngeal Swab; Nasopharyngeal(NP) swabs in vial transport medium  Result Value Ref Range   SARS Coronavirus 2 by RT PCR NEGATIVE NEGATIVE    Comment: (NOTE) SARS-CoV-2 target nucleic acids are NOT DETECTED.  The SARS-CoV-2 RNA is generally detectable in upper respiratoy specimens during the acute phase of infection. The lowest concentration of SARS-CoV-2 viral copies this assay can detect is 131 copies/mL. A negative result does not preclude SARS-Cov-2 infection and should not be used as the sole basis for treatment or other patient management decisions. A negative result may occur with  improper specimen collection/handling, submission of specimen other than nasopharyngeal swab, presence of viral mutation(s) within the areas targeted by this assay, and inadequate number of viral copies (<131 copies/mL). A negative result must be combined with clinical observations, patient history, and epidemiological information. The expected result is Negative.  Fact Sheet for Patients:  PinkCheek.be  Fact Sheet for Healthcare Providers:  GravelBags.it  This test is no t yet approved or cleared by the [place] FDA and  has been authorized for detection and/or diagnosis of SARS-CoV-2 by FDA under an Emergency Use Authorization (EUA). This EUA will remain  in effect (meaning this test can be used) for the duration of the COVID-19 declaration under Section 564(b)(1) of the Act, 21 U.S.C. section 360bbb-3(b)(1), unless the authorization is terminated  or revoked sooner.     Influenza A by PCR NEGATIVE NEGATIVE   Influenza B by PCR NEGATIVE NEGATIVE    Comment: (NOTE) The Xpert Xpress SARS-CoV-2/FLU/RSV assay is intended as an aid in  the diagnosis of influenza from Nasopharyngeal swab specimens and  should not be used as a sole basis for treatment. Nasal washings and  aspirates are unacceptable for Xpert Xpress SARS-CoV-2/FLU/RSV  testing.  Fact Sheet for Patients: PinkCheek.be  Fact Sheet for Healthcare Providers: GravelBags.it  This test is not yet approved or cleared by the [place] FDA and  has been authorized for detection and/or diagnosis of SARS-CoV-2 by  FDA under an Emergency Use Authorization (EUA). This EUA will remain  in effect (meaning this test can be used) for the duration of the  Covid-19 declaration under Section 564(b)(1) of the Act, 21  U.S.C. section 360bbb-3(b)(1), unless the authorization is  terminated or revoked. Performed at [facility], [place] [address] [place], [place] [address]     CT ABDOMEN PELVIS W CONTRAST  Result Date: 12/03/2019 CLINICAL DATA:  Bowel obstruction, suspected EXAM: CT ABDOMEN AND PELVIS WITH CONTRAST TECHNIQUE: Multidetector CT imaging of the abdomen and pelvis was performed using the standard protocol following bolus administration of intravenous contrast. CONTRAST:  176mL OMNIPAQUE IOHEXOL 300 MG/ML  SOLN COMPARISON:  None. FINDINGS: Lower chest: The visualized heart size within normal limits. No pericardial fluid/thickening. No hiatal hernia. The visualized portions of the lungs are clear. Hepatobiliary: The liver is normal in density without focal abnormality.The main portal vein is patent. The patient is status post cholecystectomy. No biliary ductal dilation. Pancreas: Unremarkable. No pancreatic ductal dilatation or surrounding inflammatory changes. Spleen: Normal in size without focal abnormality.  Adrenals/Urinary Tract: Both adrenal glands appear normal. Punctate calcifications seen in the lower pole of the right kidney. Bladder is unremarkable. Stomach/Bowel: The stomach is unremarkable. There is mildly dilated air and fluid-filled loops of small bowel measuring up to 3.4 cm to the level of the mid ileal loops where there is a focal area of narrowing in the remainder of the distal ileal loops appear to be decompressed. Scattered colonic diverticula are noted. Vascular/Lymphatic: There are no enlarged mesenteric, retroperitoneal, or pelvic lymph nodes. No significant vascular findings are present. Reproductive: The prostate is unremarkable. Other: No evidence of abdominal wall mass or hernia. Musculoskeletal: No acute or significant osseous findings. IMPRESSION: Findings consistent with a small-bowel obstruction to the level of a focal area of narrowing in a distal ileal loop. There is mild wall thickening seen within transition point of the distal ileal loop which could be due to mild enteritis. Nonobstructing right renal calculus. Electronically Signed   By: Prudencio Pair M.D.   On: 12/03/2019 15:14   DG ABD ACUTE 2+V W 1V CHEST  Result Date: 12/03/2019 CLINICAL DATA:  Upper abdominal pain, nausea, vomiting and diarrhea. EXAM: DG ABDOMEN ACUTE WITH 1 VIEW CHEST COMPARISON:  Chest radiographs dated 06/24/2009. Abdomen and pelvis CT dated 04/20/2014. FINDINGS: Normal sized heart. Tortuous aorta. Clear lungs. Multiple moderately to markedly dilated small bowel loops containing multiple air-fluid levels. No free peritoneal air. Cholecystectomy clips. L5-S1 postoperative changes, including fixation hardware. Lumbar and lower thoracic spine degenerative changes. Mild dextroconvex lumbar scoliosis. IMPRESSION: Small bowel obstruction. These results will be called to the ordering clinician or representative by the Radiologist Assistant, and communication documented in the PACS or [employer].  Electronically Signed   By: Claudie Revering M.D.   On: 12/03/2019 10:25    Review of Systems  Constitutional: Negative for chills and fever.  HENT: Negative for ear discharge, hearing loss and sore throat.   Eyes: Negative for discharge.  Respiratory: Negative for cough and shortness of breath.   Cardiovascular: Negative for chest pain and leg swelling.  Gastrointestinal: Positive for abdominal pain, diarrhea, nausea and vomiting. Negative for constipation.  Musculoskeletal: Negative for myalgias and neck pain.  Skin: Negative for rash.  Allergic/Immunologic: Negative for environmental allergies.  Neurological: Negative for dizziness and seizures.  Hematological: Does not bruise/bleed easily.  Psychiatric/Behavioral: Negative for suicidal ideas.  All other systems reviewed and are negative.  Blood pressure [name])  146/92, pulse 73, temperature 98.7 F (37.1 C), temperature source Oral, resp. rate (!) 23, height 6\' 2"  (1.88 m), weight 92.1 kg, SpO2 95 %. Physical Exam Constitutional:      Appearance: He is well-developed.     Comments: Conversant No acute distress  Eyes:     General: Lids are normal. No scleral icterus.    Comments: Pupils are equal round and reactive No lid lag Moist conjunctiva  Neck:     Thyroid: No thyromegaly.     Trachea: No tracheal tenderness.     Comments: No cervical lymphadenopathy Cardiovascular:     Rate and Rhythm: Normal rate and regular rhythm.     Heart sounds: No murmur heard.   Pulmonary:     Effort: Pulmonary effort is normal.     Breath sounds: Normal breath sounds. No wheezing or rales.  Abdominal:     General: There is distension.     Tenderness: There is no abdominal tenderness. There is no guarding or rebound.     Hernia: No hernia is present.  Skin:    General: Skin is warm.     Findings: No rash.     Nails: There is no clubbing.     Comments: Normal skin turgor  Neurological:     Mental Status: He is alert and oriented to person,  place, and time.     Comments: Normal gait and station  Psychiatric:        Judgment: Judgment normal.     Comments: Appropriate affect     Assessment/Plan: 59 year old [name] with SBO, history of lap chole 1.  We will begin SBO protocol. 2.  We will follow along.  Ralene Ok 12/03/2019, 3:39 PM

## 2019-12-03 NOTE — H&P (Addendum)
History and Physical    Alvin [name]:[id number] DOB: 08/05/1960 DOA: 12/03/2019  PCP: Alvin Ruff, MD Consultants:  Alvin [name] - Alvin [name] Patient coming from:  Home - lives with wife and daughter; NOK: Wife, Alvin [name], [phone]; Daughter, Alvin [name]  Chief Complaint: ?SBO  HPI: Alvin [name] is a 59 y.o. male with no significant medical history presenting with n/v since colonoscopy on 11/15.  He had vomiting and diarrhea since colonoscopy with some black spots in it, not sure if this was blood.  His PCP recommended Xray this AM and it appeared to show a bowel blockage and they sent him to the ER.  Colonoscopy was done on Monday.  He started feeling bad the next day.  This was his second C-scope and he did not have problems this time.  He did have a polypectomy x 2 during the procedure.  He feels ok now.  N/V stopped once he got medication in the ER.  Last diarrhea was 2 days ago.    ED Course:  SBO s/p colonoscopy Monday.  Abd pain, n/v following C-scope.  Not passing much gas.  KUB done outpatient with SBO.  H/o cholecystectomy remotely.  Surgery will see, NG tube placed.  Review of Systems: As per HPI; otherwise review of systems reviewed and negative.   Ambulatory Status:  Ambulates without assistance  COVID Vaccine Status:   Complete with booster  Past Medical History:  Diagnosis Date  . DJD (degenerative joint disease)    on disability  . Essential hypertension   . Gallstones    scan 3 months ago per pt  . Lipoma    left thigh    Past Surgical History:  Procedure Laterality Date  . BACK SURGERY  2014   lower back, screws and rod  . CHOLECYSTECTOMY N/A 09/25/2016   Procedure: LAPAROSCOPIC CHOLECYSTECTOMY WITH INTRAOPERATIVE CHOLANGIOGRAM;  Surgeon: Alvin Hausen, MD;  Location: [facility];  Service: General;  Laterality: N/A;  . LIPOMA EXCISION Left 12/27/2014   Procedure: EXCISION OF LEFT THIGH LIPOMA;  Surgeon: Alvin Cancel, MD;  Location:  [facility];  Service: Orthopedics;  Laterality: Left;  . right leg benign tumor removed  7-8 yrs ago    Social History   Socioeconomic History  . Marital status: Married    Spouse name: Not on file  . Number of children: Not on file  . Years of education: Not on file  . Highest education level: Not on file  Occupational History  . Occupation: disabled  Tobacco Use  . Smoking status: Former Smoker    Packs/day: 0.25    Years: 10.00    Pack years: 2.50    Types: Cigarettes  . Smokeless tobacco: Never Used  . Tobacco comment: very remote  Substance and Sexual Activity  . Alcohol use: Yes    Comment: occasional  . Drug use: No  . Sexual activity: Not on file  Other Topics Concern  . Not on file  Social History Narrative  . Not on file   Social Determinants of Health   Financial Resource Strain:   . Difficulty of Paying Living Expenses: Not on file  Food Insecurity:   . Worried About Charity fundraiser in the Last Year: Not on file  . Ran Out of Food in the Last Year: Not on file  Transportation Needs:   . Lack of Transportation (Medical): Not on file  . Lack of Transportation (Non-Medical): Not on file  Physical Activity:   . Days of Exercise  per Week: Not on file  . Minutes of Exercise per Session: Not on file  Stress:   . Feeling of Stress : Not on file  Social Connections:   . Frequency of Communication with Friends and Family: Not on file  . Frequency of Social Gatherings with Friends and Family: Not on file  . Attends Religious Services: Not on file  . Active Member of Clubs or Organizations: Not on file  . Attends Archivist Meetings: Not on file  . Marital Status: Not on file  Intimate Partner Violence:   . Fear of [name] or Ex-Partner: Not on file  . Emotionally Abused: Not on file  . Physically Abused: Not on file  . Sexually Abused: Not on file    No Known Allergies  History reviewed. No pertinent family history.  Prior to Admission  medications   Medication Sig Start Date End Date Taking? Authorizing Provider  acetaminophen (TYLENOL) 325 MG tablet Take 650 mg by mouth every 6 (six) hours as needed for fever.   Yes [provider]  gabapentin (NEURONTIN) 300 MG capsule Take 300 mg by mouth at bedtime.   Yes [provider]  hydrochlorothiazide (MICROZIDE) 12.5 MG capsule Take 12.5 mg by mouth daily. 11/26/19  Yes [provider]  HYDROcodone-acetaminophen (NORCO/VICODIN) 5-325 MG tablet Take 1 tablet by mouth every 4 (four) hours as needed for moderate pain. 09/26/16  Yes Simaan, Darci Current, PA-C  ibuprofen (ADVIL) 800 MG tablet Take 800 mg by mouth every 8 (eight) hours as needed for mild pain.    Yes [provider]  lisinopril (ZESTRIL) 40 MG tablet Take 40 mg by mouth daily.  11/26/19  Yes [provider]  naproxen (NAPROSYN) 500 MG tablet Take 500 mg by mouth in the morning and at bedtime.  11/26/19  Yes [provider]  omeprazole (PRILOSEC) 40 MG capsule Take 40 mg by mouth daily. 09/20/16  Yes [provider]  traZODone (DESYREL) 100 MG tablet Take 100 mg by mouth at bedtime as needed for sleep.  11/26/19  Yes [provider]    Physical Exam: Vitals:   12/03/19 1430 12/03/19 1500 12/03/19 1530 12/03/19 1600  BP: (!) 153/92 (!) 146/92 (!) 148/90 (!) 139/92  Pulse: 72 73 75 75  Resp: (!) 22 (!) 23 15 18   Temp:      TempSrc:      SpO2: 97% 95% 98% 96%  Weight:      Height:         . General:  Appears calm and comfortable and is NAD . Eyes:  PERRL, EOMI, normal lids, iris . ENT:  grossly normal hearing, lips & tongue, mmm; appropriate dentition . Neck:  no LAD, masses or thyromegaly . Cardiovascular:  RRR, no m/r/g. No LE edema.  [name] Respiratory:   CTA bilaterally with no wheezes/rales/rhonchi.  Normal to mildly increased respiratory effort. . Abdomen:  soft, mildly TTP in upper quadrants, mildly distended diffusely, hypoactive BS . Back:    normal alignment, no CVAT . Skin:  no rash or induration seen on limited exam . Musculoskeletal:  grossly normal tone BUE/BLE, good ROM, no bony abnormality . Psychiatric:  grossly normal mood and affect, speech fluent and appropriate, AOx3 Neurologic:  CN 2-12 grossly intact, moves all extremities in coordinated fashion   Radiological Exams on Admission: Independently reviewed - see discussion in A/P where applicable  CT ABDOMEN PELVIS W CONTRAST  Result Date: 12/03/2019 CLINICAL DATA:  Bowel obstruction, suspected EXAM:  CT ABDOMEN AND PELVIS WITH CONTRAST TECHNIQUE: Multidetector CT imaging of the abdomen and pelvis was performed using the standard protocol following bolus administration of intravenous contrast. CONTRAST:  140mL OMNIPAQUE IOHEXOL 300 MG/ML  SOLN COMPARISON:  None. FINDINGS: Lower chest: The visualized heart size within normal limits. No pericardial fluid/thickening. No hiatal hernia. The visualized portions of the lungs are clear. Hepatobiliary: The liver is normal in density without focal abnormality.The main portal vein is patent. The patient is status post cholecystectomy. No biliary ductal dilation. Pancreas: Unremarkable. No pancreatic ductal dilatation or surrounding inflammatory changes. Spleen: Normal in size without focal abnormality. Adrenals/Urinary Tract: Both adrenal glands appear normal. Punctate calcifications seen in the lower pole of the right kidney. Bladder is unremarkable. Stomach/Bowel: The stomach is unremarkable. There is mildly dilated air and fluid-filled loops of small bowel measuring up to 3.4 cm to the level of the mid ileal loops where there is a focal area of narrowing in the remainder of the distal ileal loops appear to be decompressed. Scattered colonic diverticula are noted. Vascular/Lymphatic: There are no enlarged mesenteric, retroperitoneal, or pelvic lymph nodes. No significant vascular findings are present. Reproductive: The prostate is  unremarkable. Other: No evidence of abdominal wall mass or hernia. Musculoskeletal: No acute or significant osseous findings. IMPRESSION: Findings consistent with a small-bowel obstruction to the level of a focal area of narrowing in a distal ileal loop. There is mild wall thickening seen within transition point of the distal ileal loop which could be due to mild enteritis. Nonobstructing right renal calculus. Electronically Signed   By: Prudencio Pair M.D.   On: 12/03/2019 15:14   DG ABD ACUTE 2+V W 1V CHEST  Result Date: 12/03/2019 CLINICAL DATA:  Upper abdominal pain, nausea, vomiting and diarrhea. EXAM: DG ABDOMEN ACUTE WITH 1 VIEW CHEST COMPARISON:  Chest radiographs dated 06/24/2009. Abdomen and pelvis CT dated 04/20/2014. FINDINGS: Normal sized heart. Tortuous aorta. Clear lungs. Multiple moderately to markedly dilated small bowel loops containing multiple air-fluid levels. No free peritoneal air. Cholecystectomy clips. L5-S1 postoperative changes, including fixation hardware. Lumbar and lower thoracic spine degenerative changes. Mild dextroconvex lumbar scoliosis. IMPRESSION: Small bowel obstruction. These results will be called to the ordering clinician or representative by the Radiologist Assistant, and communication documented in the PACS or [employer]. Electronically Signed   By: Claudie Revering M.D.   On: 12/03/2019 10:25    EKG: not done   Labs on Admission: I have personally reviewed the available labs and imaging studies at the time of the admission.  Pertinent labs:   Na++ 133 K+ 3.2 Glucose 133 BUN 28/Creatinine 1.11/GFR >60 WBC 5.4 Platelets 405 COVID/flu negative UA: 15 ketones   Assessment/Plan Principal Problem:   SBO (small bowel obstruction) (HCC) Active Problems:   Essential hypertension   DJD (degenerative joint disease)   Hyperglycemia   SBO -Patient with prior h/o abdominal surgeries presenting with acute onset of abdominal pain with n/v, abdominal  distention, and CT findings c/w SBO -Patient had colonoscopy on 11/15 and subsequently developed symptoms post-procedure -Will admit to Med Surg -Gen Surg consulted by ER and will see; currently no indication for surgical intervention -80% of SBO will resolve without surgery -High-grade SBO can usually be safely managed non-operatively -If PO contrast reaches colon within 24 hours, SBO will very likely certainly resolve without surgery -NPO for bowel rest -NG tube to be placed -IVF hydration -Pain control with morphine -[name] guidelines recommend that patients without resolution undergo surgery by 3-5 days -Dr. Alessandra Bevels  has spoken with the family today and will plan to see the patient tomorrow.  HTN -Hold HCTZ and Lisinopril while NPO -Will cover with prn IV hydralazine  Hyperglycemia -May be stress response -Will follow with fasting AM labs  Chronic back pain -I have reviewed this patient in the [place] Controlled Substances Reporting System.   -He is not receiving controlled substance prescriptions (despite Norco listed on Med Rec) -Hold Neurontin and Trazodone while NPO and resume when tolerating a diet -Hold Naproxen and Advil     Note: This patient has been tested and is negative for the novel coronavirus COVID-19. The patient has been fully vaccinated against COVID-19.    DVT prophylaxis: SCDs Code Status:  Full - confirmed with patient Family Communication: None present; I spoke with the patient's daughter by telephone at the time of admission. Disposition Plan:  The patient is from: home  Anticipated d/c is to: home without [facility] services   Anticipated d/c date will depend on clinical response to treatment, but likely 1-3 days  Patient is currently: acutely [name] Consults called: Surgery  Admission status:  Admit - It is my clinical opinion that admission to INPATIENT is reasonable and necessary because of the expectation that this patient will require hospital care that  crosses at least 2 midnights to treat this condition based on the medical complexity of the problems presented.  Given the aforementioned information, the predictability of an adverse outcome is felt to be significant.    Karmen Bongo MD [facility]   How to contact the [facility] Attending or Consulting provider [place] or covering provider during after hours [place], for this patient?  1. Check the care team in [facility] and look for a) attending/consulting [facility] provider listed and b) the [facility] team listed 2. Log into [email or web address] and use [place]'s universal password to access. If you do not have the password, please contact the hospital operator. 3. Locate the [facility] provider you are looking for under [facility] and page to a number that you can be directly reached. 4. If you still have difficulty reaching the provider, please page the [facility] (Director on Call) for the Hospitalists listed on amion for assistance.   12/03/2019, 4:40 PM

## 2019-12-03 NOTE — ED Provider Notes (Signed)
[facility] EMERGENCY DEPARTMENT Provider Note   CSN: [id number] Arrival date & time: 12/03/19  1212     History Chief Complaint  Patient presents with  . bowel obstruction    Alvin [name] is a 59 y.o. male past was here [facility]G, Golda's, lipoma who presents for evaluation of 2 and half days of abdominal pain, vomiting.  He reports that he had a colonoscopy performed on 11/30/2019.  He states that initially after the colonoscopy he felt fine but then throughout the night, he started having some abdominal pain.  He states that the next day, he started having vomiting and over the course the last 2 days, he has not been able to tolerate any p.o.  He thinks his last bowel movement was last night.  He does not think he has been passing gas.  No blood noted in the emesis.  He states he has had a history of abdominal surgeries before a few years ago.  He does not remember exactly what it was for.  He has not noted any fevers, chest pain, difficulty breathing, dysuria, hematuria.  The history is provided by the patient.       Past Medical History:  Diagnosis Date  . DJD (degenerative joint disease)   . Gallstones    scan 3 months ago per pt  . Lipoma    left thigh    Patient Active Problem List   Diagnosis Date Noted  . Acute cholecystitis 09/24/2016  . Lipoma of thigh 12/27/2014    Past Surgical History:  Procedure Laterality Date  . BACK SURGERY  2014   lower back, screws and rod  . CHOLECYSTECTOMY N/A 09/25/2016   Procedure: LAPAROSCOPIC CHOLECYSTECTOMY WITH INTRAOPERATIVE CHOLANGIOGRAM;  Surgeon: Johnathan Hausen, MD;  Location: [facility];  Service: General;  Laterality: N/A;  . LIPOMA EXCISION Left 12/27/2014   Procedure: EXCISION OF LEFT THIGH LIPOMA;  Surgeon: Paralee Cancel, MD;  Location: [facility];  Service: Orthopedics;  Laterality: Left;  . right leg benign tumor removed  7-8 yrs ago       No family history on file.  Social History   Tobacco Use  .  Smoking status: Former Smoker    Types: Cigarettes  . Smokeless tobacco: Never Used  Substance Use Topics  . Alcohol use: Yes    Comment: occasional  . Drug use: No    Home Medications Prior to Admission medications   Medication Sig Start Date End Date Taking? Authorizing Provider  acetaminophen (TYLENOL) 325 MG tablet Take 650 mg by mouth every 6 (six) hours as needed for fever.   Yes [provider]  gabapentin (NEURONTIN) 300 MG capsule Take 300 mg by mouth at bedtime.   Yes [provider]  hydrochlorothiazide (MICROZIDE) 12.5 MG capsule Take 12.5 mg by mouth daily. 11/26/19  Yes [provider]  HYDROcodone-acetaminophen (NORCO/VICODIN) 5-325 MG tablet Take 1 tablet by mouth every 4 (four) hours as needed for moderate pain. 09/26/16  Yes Simaan, Darci Current, PA-C  ibuprofen (ADVIL) 800 MG tablet Take 800 mg by mouth every 8 (eight) hours as needed for mild pain.    Yes [provider]  lisinopril (ZESTRIL) 40 MG tablet Take 40 mg by mouth daily.  11/26/19  Yes [provider]  naproxen (NAPROSYN) 500 MG tablet Take 500 mg by mouth in the morning and at bedtime.  11/26/19  Yes [provider]  omeprazole (PRILOSEC) 40 MG capsule Take 40 mg by mouth daily. 09/20/16  Yes  [provider]  traZODone (DESYREL) 100 MG tablet Take 100 mg by mouth at bedtime as needed for sleep.  11/26/19  Yes [provider]    Allergies    Patient has no known allergies.  Review of Systems   Review of Systems  HENT: Negative for congestion.   Eyes: Negative for visual disturbance.  Respiratory: Negative for cough and shortness of breath.   Cardiovascular: Negative for chest pain.  Gastrointestinal: Positive for abdominal pain, nausea and vomiting. Negative for diarrhea.  Genitourinary: Negative for dysuria and hematuria.  Musculoskeletal: Negative for back pain.  Skin: Negative for rash.  Neurological: Negative for dizziness,  numbness and headaches.  Psychiatric/Behavioral: Negative for confusion.  All other systems reviewed and are negative.   Physical Exam Updated Vital Signs BP (!) 146/92   Pulse 73   Temp 98.7 F (37.1 C) (Oral)   Resp (!) 23   Ht 6\' 2"  (1.88 m)   Wt 92.1 kg   SpO2 95%   BMI 26.06 kg/m   Physical Exam Vitals and nursing note reviewed.  Constitutional:      Appearance: Normal appearance. He is well-developed.  HENT:     Head: Normocephalic and atraumatic.  Eyes:     General: Lids are normal.     Conjunctiva/sclera: Conjunctivae normal.     Pupils: Pupils are equal, round, and reactive to light.  Cardiovascular:     Rate and Rhythm: Normal rate and regular rhythm.     Pulses: Normal pulses.     Heart sounds: Normal heart sounds. No murmur heard.  No friction rub. No gallop.   Pulmonary:     Effort: Pulmonary effort is normal.     Breath sounds: Normal breath sounds.     Comments: Lungs clear to auscultation bilaterally.  Symmetric chest rise.  No wheezing, rales, rhonchi.  Abdominal:     General: Bowel sounds are decreased.     Palpations: Abdomen is soft. Abdomen is not rigid.     Tenderness: There is abdominal tenderness in the right upper quadrant. There is no guarding.     Comments: Slight abdominal distention.  Decreased bowel sounds noted.  Tenderness noted diffusely but more notably in the right upper quadrant.  Musculoskeletal:        General: Normal range of motion.     Cervical back: Full passive range of motion without pain.  Skin:    General: Skin is warm and dry.     Capillary Refill: Capillary refill takes less than 2 seconds.  Neurological:     Mental Status: He is alert and oriented to person, place, and time.  Psychiatric:        Speech: Speech normal.     ED Results / Procedures / Treatments   Labs (all labs ordered are listed, but only abnormal results are displayed) Labs Reviewed  COMPREHENSIVE METABOLIC PANEL - Abnormal; Notable for the  following components:      Result Value   Sodium 133 (*)    Potassium 3.2 (*)    Chloride 95 (*)    Glucose, Bld 133 (*)    BUN 28 (*)    Calcium 11.1 (*)    All other components within normal limits  CBC - Abnormal; Notable for the following components:   Platelets 405 (*)    All other components within normal limits  URINALYSIS, ROUTINE W REFLEX MICROSCOPIC - Abnormal; Notable for the following components:   Color, Urine ORANGE (*)    APPearance  HAZY (*)    Specific Gravity, Urine >1.030 (*)    Bilirubin Urine SMALL (*)    Ketones, ur 15 (*)    All other components within normal limits  RESPIRATORY PANEL BY RT PCR (FLU A&B, COVID)  LIPASE, BLOOD    EKG None  Radiology CT ABDOMEN PELVIS W CONTRAST  Result Date: 12/03/2019 CLINICAL DATA:  Bowel obstruction, suspected EXAM: CT ABDOMEN AND PELVIS WITH CONTRAST TECHNIQUE: Multidetector CT imaging of the abdomen and pelvis was performed using the standard protocol following bolus administration of intravenous contrast. CONTRAST:  173mL OMNIPAQUE IOHEXOL 300 MG/ML  SOLN COMPARISON:  None. FINDINGS: Lower chest: The visualized heart size within normal limits. No pericardial fluid/thickening. No hiatal hernia. The visualized portions of the lungs are clear. Hepatobiliary: The liver is normal in density without focal abnormality.The main portal vein is patent. The patient is status post cholecystectomy. No biliary ductal dilation. Pancreas: Unremarkable. No pancreatic ductal dilatation or surrounding inflammatory changes. Spleen: Normal in size without focal abnormality. Adrenals/Urinary Tract: Both adrenal glands appear normal. Punctate calcifications seen in the lower pole of the right kidney. Bladder is unremarkable. Stomach/Bowel: The stomach is unremarkable. There is mildly dilated air and fluid-filled loops of small bowel measuring up to 3.4 cm to the level of the mid ileal loops where there is a focal area of narrowing in the remainder  of the distal ileal loops appear to be decompressed. Scattered colonic diverticula are noted. Vascular/Lymphatic: There are no enlarged mesenteric, retroperitoneal, or pelvic lymph nodes. No significant vascular findings are present. Reproductive: The prostate is unremarkable. Other: No evidence of abdominal wall mass or hernia. Musculoskeletal: No acute or significant osseous findings. IMPRESSION: Findings consistent with a small-bowel obstruction to the level of a focal area of narrowing in a distal ileal loop. There is mild wall thickening seen within transition point of the distal ileal loop which could be due to mild enteritis. Nonobstructing right renal calculus. Electronically Signed   By: Prudencio Pair M.D.   On: 12/03/2019 15:14   DG ABD ACUTE 2+V W 1V CHEST  Result Date: 12/03/2019 CLINICAL DATA:  Upper abdominal pain, nausea, vomiting and diarrhea. EXAM: DG ABDOMEN ACUTE WITH 1 VIEW CHEST COMPARISON:  Chest radiographs dated 06/24/2009. Abdomen and pelvis CT dated 04/20/2014. FINDINGS: Normal sized heart. Tortuous aorta. Clear lungs. Multiple moderately to markedly dilated small bowel loops containing multiple air-fluid levels. No free peritoneal air. Cholecystectomy clips. L5-S1 postoperative changes, including fixation hardware. Lumbar and lower thoracic spine degenerative changes. Mild dextroconvex lumbar scoliosis. IMPRESSION: Small bowel obstruction. These results will be called to the ordering clinician or representative by the Radiologist Assistant, and communication documented in the PACS or [employer]. Electronically Signed   By: Claudie Revering M.D.   On: 12/03/2019 10:25    Procedures Procedures (including critical care time)  Medications Ordered in ED Medications  ondansetron (ZOFRAN) injection 4 mg (4 mg Intravenous Given 12/03/19 1339)  morphine 4 MG/ML injection 4 mg (4 mg Intravenous Given 12/03/19 1339)  sodium chloride 0.9 % bolus 1,000 mL (1,000 mLs Intravenous New  Bag/Given 12/03/19 1342)  iohexol (OMNIPAQUE) 300 MG/ML solution 100 mL (100 mLs Intravenous Contrast Given 12/03/19 1443)    ED Course  I have reviewed the triage vital signs and the nursing notes.  Pertinent labs & imaging results that were available during my care of the patient were reviewed by me and considered in my medical decision making (see chart for details).    MDM Rules/Calculators/A&P  59 year old male who presents for evaluation of abdominal pain, nausea/vomiting.  Recent colonoscopy done on 11/30/2019.  Started having abdominal pain, nausea/vomiting.  Has not been able to keep anything down over the last 2 days.  Had an outpatient x-ray done today that showed small bowel obstruction was sent to the ED for further evaluation.  Initial arrival, he is afebrile, appears uncomfortable but no acute distress.  He vitals are stable.  On exam, he has diffuse abdominal tenderness is slightly worse in right upper quadrant.  He has decreased bowel sounds.  Review of his record showing an outpatient KUB that showed an obstruction.  Will obtain blood work, give analgesics and CT scan.  CBC shows no leukocytosis or anemia.  CMP shows potassium of 3.2.  BUN and creatinine within normal limits.  Covid is negative.  Lipase normal.  UA is negative for infectious etiology.  CT abdomen shows findings consistent with small bowel obstruction to the level of a focal area of narrowing in the distal ileal loop.  Discussed with Alferd Apa (GEN surge PA).  GEN surge will consult on him.  Recommends NG tube placement and medical admission.  Discussed with Dr. Lorin Mercy (hospitalist) who accepts patient for admission.  Portions of this note were generated with [employer]. Dictation errors may occur despite best attempts at proofreading.   Final Clinical Impression(s) / ED Diagnoses Final diagnoses:  Small bowel obstruction [facility])    Rx / DC Orders ED Discharge  Orders    None       Desma Mcgregor 12/03/19 1520    Wyvonnia Dusky, MD 12/03/19 1818

## 2019-12-03 NOTE — ED Triage Notes (Signed)
Pt had xray done at [facility] this AM that showed bowel obstruction. Colonoscopy preformed Monday. Last BM yesterday. N/V for 2.5 days.

## 2019-12-04 ENCOUNTER — Inpatient Hospital Stay (HOSPITAL_COMMUNITY): Payer: Medicare Other

## 2019-12-04 DIAGNOSIS — K56609 Unspecified intestinal obstruction, unspecified as to partial versus complete obstruction: Secondary | ICD-10-CM | POA: Diagnosis not present

## 2019-12-04 LAB — CBC
HCT: 46 % (ref 39.0–52.0)
Hemoglobin: 15.7 g/dL (ref 13.0–17.0)
MCH: 30.3 pg (ref 26.0–34.0)
MCHC: 34.1 g/dL (ref 30.0–36.0)
MCV: 88.6 fL (ref 80.0–100.0)
Platelets: 376 10*3/uL (ref 150–400)
RBC: 5.19 MIL/uL (ref 4.22–5.81)
RDW: 11.9 % (ref 11.5–15.5)
WBC: 5.8 10*3/uL (ref 4.0–10.5)
nRBC: 0 % (ref 0.0–0.2)

## 2019-12-04 LAB — BASIC METABOLIC PANEL
Anion gap: 10 (ref 5–15)
BUN: 24 mg/dL — ABNORMAL HIGH (ref 6–20)
CO2: 29 mmol/L (ref 22–32)
Calcium: 11.1 mg/dL — ABNORMAL HIGH (ref 8.9–10.3)
Chloride: 98 mmol/L (ref 98–111)
Creatinine, Ser: 1 mg/dL (ref 0.61–1.24)
GFR, Estimated: >60 mL/min (ref >60)
Glucose, Bld: 100 mg/dL — ABNORMAL HIGH (ref 70–99)
Potassium: 3.1 mmol/L — ABNORMAL LOW (ref 3.5–5.1)
Sodium: 137 mmol/L (ref 135–145)

## 2019-12-04 LAB — MAGNESIUM: Magnesium: 2 mg/dL (ref 1.7–2.4)

## 2019-12-04 MED ORDER — POTASSIUM CHLORIDE 10 MEQ/100ML IV SOLN
10.0000 meq | INTRAVENOUS | Status: AC
  Administered 2019-12-04 (×6): 10 meq via INTRAVENOUS
  Filled 2019-12-04 (×6): qty 100

## 2019-12-04 NOTE — Progress Notes (Signed)
[facility] Progress Note  Patient: Alvin [name]    KXF:[id number]  DOA: 12/03/2019     Date of Service: the patient was seen and examined on 12/04/2019  Brief hospital course: No significant past medical history.  Presents with nausea and vomiting.  Found to have SBO.  General surgery and GI consulted. Currently plan is conservative management.  Assessment and Plan: 1.  Small bowel obstruction. General surgery and GI were consulted. NG tube inserted. Significant output from the NG. Continue IV fluid. Potassium low magnesium low.  Will replace. Surgery initiating Gastrografin contrast. We will monitor response.  2.  Essential hypertension Blood pressure stable.  Lungs were Remains NPO. Holding home blood pressure medication.  3.  Chronic back pain Monitor for now. Holding home Neurontin and trazodone for now.  4.  Hypokalemia Replacing.  Monitor.  5.  Recent colonoscopy GI recommend GI pathogen panel  Diet: NPO DVT Prophylaxis:   SCDs Start: 12/03/19 1633    Advance goals of care discussion: Full code  Family Communication: no family was present at bedside, at the time of interview.   Disposition:  Status is: Inpatient  Remains inpatient appropriate because:IV treatments appropriate due to intensity of illness or inability to take PO  Dispo: The patient is from: Home              Anticipated d/c is to: Home              Anticipated d/c date is: 1 day              Patient currently is not medically stable to d/c.  Subjective: Passing gas.  No nausea no vomiting.  Significant output from NGT.  Ambulating.  Physical Exam:  General: Appear in mild distress, no Rash; Oral Mucosa Clear, moist. no Abnormal Neck Mass Or lumps, Conjunctiva normal  Cardiovascular: S1 and S2 Present, no Murmur, Respiratory: good respiratory effort, Bilateral Air entry present and CTA, no Crackles, no wheezes Abdomen: Bowel Sound present, Soft and no tenderness Extremities:  no Pedal edema Neurology: alert and oriented to time, place, and person affect appropriate. no new focal deficit Gait not checked due to patient safety concerns  Vitals:   12/03/19 2011 12/04/19 0154 12/04/19 0414 12/04/19 1319  BP: (!) 155/79 137/83 135/81 (!) 152/85  Pulse: 72 78 75 79  Resp: 18 18 18 16   Temp: 98.3 F (36.8 C) 98.4 F (36.9 C) 98.2 F (36.8 C) (!) 97.5 F (36.4 C)  TempSrc: Oral Oral Oral Oral  SpO2: 97% 95% 96% 95%  Weight:      Height:        Intake/Output Summary (Last 24 hours) at 12/04/2019 1900 Last data filed at 12/04/2019 0818 Gross per 24 hour  Intake 109.4 ml  Output 900 ml  Net -790.6 ml   Filed Weights   12/03/19 1224  Weight: 92.1 kg    Data Reviewed: I have personally reviewed and interpreted daily labs, tele strips, imagings as discussed above. I reviewed all nursing notes, pharmacy notes, vitals, pertinent old records I have discussed plan of care as described above with RN and patient/family.  CBC: Recent Labs  Lab 12/03/19 1242 12/04/19 0050  WBC 5.4 5.8  HGB 16.5 15.7  HCT 48.5 46.0  MCV 88.8 88.6  PLT 405* 696   Basic Metabolic Panel: Recent Labs  Lab 12/03/19 1242 12/04/19 0050  NA 133* 137  K 3.2* 3.1*  CL 95* 98  CO2 24 29  GLUCOSE 133* 100*  BUN 28* 24*  CREATININE 1.11 1.00  CALCIUM 11.1* 11.1*  MG  --  2.0    Studies: DG Abd Portable 1V  Result Date: 12/04/2019 CLINICAL DATA:  Small bowel obstruction. EXAM: PORTABLE ABDOMEN - 1 VIEW COMPARISON:  December 03, 2019 FINDINGS: Contrast now extends through the colon into the sigmoid colon. Dilated loops of small bowel remain in the left side of the abdomen. The distal aspect of the NG tube is in the region of the stomach. No other acute abnormalities. IMPRESSION: 1. Persistent but improving small bowel obstruction. The NG tube is only partially visualized. Electronically Signed   By: Dorise Bullion III M.D   On: 12/04/2019 18:25    Scheduled  Meds: Continuous Infusions:  lactated ringers 75 mL/hr at 12/03/19 2032   PRN Meds: acetaminophen **OR** acetaminophen, hydrALAZINE, morphine injection, ondansetron **OR** ondansetron (ZOFRAN) IV, phenol  Time spent: 35 minutes  Author: Berle Mull, MD [facility] 12/04/2019 7:00 PM  To reach On-call, see care teams to locate the attending and reach out via [email or web address]. Between 7PM-7AM, please contact night-coverage If you still have difficulty reaching the attending provider, please page the [facility] (Director on Call) for [facility] on amion for assistance.

## 2019-12-04 NOTE — Consult Note (Signed)
Referring Provider:  [facility] Primary Care Physician:  Leighton Ruff, MD Primary Gastroenterologist:  Dr. Alessandra Bevels  Reason for Consultation: Small bowel obstruction  HPI: Alvin [name] is a 59 y.o. male with no significant past medical history currently admitted to the hospital with small bowel obstruction.  Patient underwent screening colonoscopy by me November 30, 2019.  He was found to have 2 small polyps which were removed with cold snare.  He also had incidental diverticulosis and internal hemorrhoids.  Patient subsequently started having abdominal pain, nausea, vomiting and diarrhea.  Outpatient abdominal x-ray was ordered which showed possible small bowel obstruction and he was advised to come to the emergency room for further evaluation.  CT abdomen pelvis with contrast yesterday showed finding consistent with a small bowel obstruction in the distal ileal loop.  There is mild wall thickening within a transition point which could be from mild enteritis.  Blood work showed normal CBC, normal lipase and normal LFTs.  He has been seen by surgery.  Currently on NG suction.  Patient seen and examined at bedside.  He is already feeling better.  Had 3 or 4 bowel movements today.  Passing gas.  He is feeling hungry.  He wants to remove NG tube if possible today.  Past Medical History:  Diagnosis Date  . DJD (degenerative joint disease)    on disability  . Essential hypertension   . Gallstones    scan 3 months ago per pt  . Lipoma    left thigh    Past Surgical History:  Procedure Laterality Date  . BACK SURGERY  2014   lower back, screws and rod  . CHOLECYSTECTOMY N/A 09/25/2016   Procedure: LAPAROSCOPIC CHOLECYSTECTOMY WITH INTRAOPERATIVE CHOLANGIOGRAM;  Surgeon: Johnathan Hausen, MD;  Location: [facility];  Service: General;  Laterality: N/A;  . LIPOMA EXCISION Left 12/27/2014   Procedure: EXCISION OF LEFT THIGH LIPOMA;  Surgeon: Paralee Cancel, MD;  Location: [facility];  Service: Orthopedics;   Laterality: Left;  . right leg benign tumor removed  7-8 yrs ago    Prior to Admission medications   Medication Sig Start Date End Date Taking? Authorizing Provider  acetaminophen (TYLENOL) 325 MG tablet Take 650 mg by mouth every 6 (six) hours as needed for fever.   Yes [provider]  gabapentin (NEURONTIN) 300 MG capsule Take 300 mg by mouth at bedtime.   Yes [provider]  hydrochlorothiazide (MICROZIDE) 12.5 MG capsule Take 12.5 mg by mouth daily. 11/26/19  Yes [provider]  HYDROcodone-acetaminophen (NORCO/VICODIN) 5-325 MG tablet Take 1 tablet by mouth every 4 (four) hours as needed for moderate pain. 09/26/16  Yes Simaan, Darci Current, PA-C  ibuprofen (ADVIL) 800 MG tablet Take 800 mg by mouth every 8 (eight) hours as needed for mild pain.    Yes [provider]  lisinopril (ZESTRIL) 40 MG tablet Take 40 mg by mouth daily.  11/26/19  Yes [provider]  naproxen (NAPROSYN) 500 MG tablet Take 500 mg by mouth in the morning and at bedtime.  11/26/19  Yes [provider]  omeprazole (PRILOSEC) 40 MG capsule Take 40 mg by mouth daily. 09/20/16  Yes [provider]  traZODone (DESYREL) 100 MG tablet Take 100 mg by mouth at bedtime as needed for sleep.  11/26/19  Yes [provider]    Scheduled Meds: Continuous Infusions: . lactated ringers 75 mL/hr at 12/03/19 2032  . potassium chloride 10 mEq (12/04/19 1105)   PRN Meds:.acetaminophen **OR** acetaminophen, hydrALAZINE, morphine  injection, ondansetron **OR** ondansetron (ZOFRAN) IV, phenol  Allergies as of 12/03/2019  . (No Known Allergies)    History reviewed. No pertinent family history.  Social History   Socioeconomic History  . Marital status: Married    Spouse name: Not on file  . Number of children: Not on file  . Years of education: Not on file  . Highest education level: Not on file  Occupational History  . Occupation: disabled  Tobacco Use   . Smoking status: Former Smoker    Packs/day: 0.25    Years: 10.00    Pack years: 2.50    Types: Cigarettes  . Smokeless tobacco: Never Used  . Tobacco comment: very remote  Substance and Sexual Activity  . Alcohol use: Yes    Comment: occasional  . Drug use: No  . Sexual activity: Not on file  Other Topics Concern  . Not on file  Social History Narrative  . Not on file   Social Determinants of Health   Financial Resource Strain:   . Difficulty of Paying Living Expenses: Not on file  Food Insecurity:   . Worried About Charity fundraiser in the Last Year: Not on file  . Ran Out of Food in the Last Year: Not on file  Transportation Needs:   . Lack of Transportation (Medical): Not on file  . Lack of Transportation (Non-Medical): Not on file  Physical Activity:   . Days of Exercise per Week: Not on file  . Minutes of Exercise per Session: Not on file  Stress:   . Feeling of Stress : Not on file  Social Connections:   . Frequency of Communication with Friends and Family: Not on file  . Frequency of Social Gatherings with Friends and Family: Not on file  . Attends Religious Services: Not on file  . Active Member of Clubs or Organizations: Not on file  . Attends Archivist Meetings: Not on file  . Marital Status: Not on file  Intimate Partner Violence:   . Fear of Current or Ex-Partner: Not on file  . Emotionally Abused: Not on file  . Physically Abused: Not on file  . Sexually Abused: Not on file    Review of Systems: All negative except as stated above in HPI.  Physical Exam: Vital signs: Vitals:   12/04/19 0154 12/04/19 0414  BP: 137/83 135/81  Pulse: 78 75  Resp: 18 18  Temp: 98.4 F (36.9 C) 98.2 F (36.8 C)  SpO2: 95% 96%   Last BM Date: 12/01/19 General:   Alert,  Well-developed, well-nourished, pleasant and cooperative in NAD, NG tube in place Lungs:  Clear throughout to auscultation.   No wheezes, crackles, or rhonchi. No acute  distress. Heart:  Regular rate and rhythm; no murmurs, clicks, rubs,  or gallops. Abdomen: Soft, nontender, nondistended, bowel sounds present. Rectal:  Deferred  GI:  Lab Results: Recent Labs    12/03/19 1242 12/04/19 0050  WBC 5.4 5.8  HGB 16.5 15.7  HCT 48.5 46.0  PLT 405* 376   BMET Recent Labs    12/03/19 1242 12/04/19 0050  NA 133* 137  K 3.2* 3.1*  CL 95* 98  CO2 24 29  GLUCOSE 133* 100*  BUN 28* 24*  CREATININE 1.11 1.00  CALCIUM 11.1* 11.1*   LFT Recent Labs    12/03/19 1242  PROT 8.1  ALBUMIN 4.3  AST 23  ALT 25  ALKPHOS 82  BILITOT 1.2   PT/INR No results for  input(s): LABPROT, INR in the last 72 hours.   Studies/Results: CT ABDOMEN PELVIS W CONTRAST  Result Date: 12/03/2019 CLINICAL DATA:  Bowel obstruction, suspected EXAM: CT ABDOMEN AND PELVIS WITH CONTRAST TECHNIQUE: Multidetector CT imaging of the abdomen and pelvis was performed using the standard protocol following bolus administration of intravenous contrast. CONTRAST:  171mL OMNIPAQUE IOHEXOL 300 MG/ML  SOLN COMPARISON:  None. FINDINGS: Lower chest: The visualized heart size within normal limits. No pericardial fluid/thickening. No hiatal hernia. The visualized portions of the lungs are clear. Hepatobiliary: The liver is normal in density without focal abnormality.The main portal vein is patent. The patient is status post cholecystectomy. No biliary ductal dilation. Pancreas: Unremarkable. No pancreatic ductal dilatation or surrounding inflammatory changes. Spleen: Normal in size without focal abnormality. Adrenals/Urinary Tract: Both adrenal glands appear normal. Punctate calcifications seen in the lower pole of the right kidney. Bladder is unremarkable. Stomach/Bowel: The stomach is unremarkable. There is mildly dilated air and fluid-filled loops of small bowel measuring up to 3.4 cm to the level of the mid ileal loops where there is a focal area of narrowing in the remainder of the distal ileal  loops appear to be decompressed. Scattered colonic diverticula are noted. Vascular/Lymphatic: There are no enlarged mesenteric, retroperitoneal, or pelvic lymph nodes. No significant vascular findings are present. Reproductive: The prostate is unremarkable. Other: No evidence of abdominal wall mass or hernia. Musculoskeletal: No acute or significant osseous findings. IMPRESSION: Findings consistent with a small-bowel obstruction to the level of a focal area of narrowing in a distal ileal loop. There is mild wall thickening seen within transition point of the distal ileal loop which could be due to mild enteritis. Nonobstructing right renal calculus. Electronically Signed   By: Prudencio Pair M.D.   On: 12/03/2019 15:14   DG ABD ACUTE 2+V W 1V CHEST  Result Date: 12/03/2019 CLINICAL DATA:  Upper abdominal pain, nausea, vomiting and diarrhea. EXAM: DG ABDOMEN ACUTE WITH 1 VIEW CHEST COMPARISON:  Chest radiographs dated 06/24/2009. Abdomen and pelvis CT dated 04/20/2014. FINDINGS: Normal sized heart. Tortuous aorta. Clear lungs. Multiple moderately to markedly dilated small bowel loops containing multiple air-fluid levels. No free peritoneal air. Cholecystectomy clips. L5-S1 postoperative changes, including fixation hardware. Lumbar and lower thoracic spine degenerative changes. Mild dextroconvex lumbar scoliosis. IMPRESSION: Small bowel obstruction. These results will be called to the ordering clinician or representative by the Radiologist Assistant, and communication documented in the PACS or [employer]. Electronically Signed   By: Claudie Revering M.D.   On: 12/03/2019 10:25   DG Abd Portable 1V-Small Bowel Protocol-Position Verification  Result Date: 12/03/2019 CLINICAL DATA:  NG tube placement confirmation EXAM: PORTABLE ABDOMEN - 1 VIEW COMPARISON:  12/03/2019 FINDINGS: NG tube coiled in the stomach. Signs of small bowel obstruction with marked dilation of small bowel similar to previous abdominal  radiograph. Signs of cholecystectomy and lumbar spine fusion. No acute skeletal process on limited assessment. IMPRESSION: 1. NG tube coiled in the stomach. 2. Signs of small bowel obstruction, similar to prior radiograph. Electronically Signed   By: Zetta Bills M.D.   On: 12/03/2019 17:22    Impression/Plan: -Small bowel obstruction in the distal ileal loop.  Symptoms started after colonoscopy.  Colonoscopy was done without any difficulty.  Total procedure time was around 15 minutes with withdrawal time of 12 minutes. -Symptoms are improving.  He is passing gas.  He is having bowel movements. -Nausea vomiting and diarrhea.  Possible enteritis remains in differential.  Recommendations ------------------------- -Continue current  management per surgical team. -Follow abdominal x-ray.  I will also order another x-ray for tomorrow morning. -Depending on x-ray finding, I think it is reasonable to try clear liquid diet if it is okay with surgical team. -Check GI pathogen panel.  He has been having any diarrhea since last few days. -GI will follow    LOS: 1 day   Otis Brace  MD, FACP 12/04/2019, 12:01 PM  Contact #  [phone]

## 2019-12-04 NOTE — Progress Notes (Signed)
Progress Note     Subjective: Patient reports abdominal pain in upper abdomen. No flatus or BM. Discussed ambulation today.    Objective: Vital signs in last 24 hours: Temp:  [97.5 F (36.4 C)-98.7 F (37.1 C)] 98.2 F (36.8 C) (11/19 0414) Pulse Rate:  [70-84] 75 (11/19 0414) Resp:  [15-24] 18 (11/19 0414) BP: (135-174)/(79-101) 135/81 (11/19 0414) SpO2:  [94 %-98 %] 96 % (11/19 0414) Weight:  [92.1 kg] 92.1 kg (11/18 1224) Last BM Date: 12/01/19  Intake/Output from previous day: 11/18 0701 - 11/19 0700 In: 109.4 [I.V.:109.4] Out: 900 [Emesis/NG output:900] Intake/Output this shift: No intake/output data recorded.  PE: General: pleasant, WD, WN male who is laying in bed in NAD Heart: regular, rate, and rhythm. Palpable radial and pedal pulses bilaterally Lungs: CTAB, no wheezes, rhonchi, or rales noted.  Respiratory effort nonlabored Abd: soft, mildly ttp in epigastrium without peritonitis, mildly distended, BS hypoactive, NGT with bilious drainage MS: all 4 extremities are symmetrical with no cyanosis, clubbing, or edema.     Lab Results:  Recent Labs    12/03/19 1242 12/04/19 0050  WBC 5.4 5.8  HGB 16.5 15.7  HCT 48.5 46.0  PLT 405* 376   BMET Recent Labs    12/03/19 1242 12/04/19 0050  NA 133* 137  K 3.2* 3.1*  CL 95* 98  CO2 24 29  GLUCOSE 133* 100*  BUN 28* 24*  CREATININE 1.11 1.00  CALCIUM 11.1* 11.1*   PT/INR No results for input(s): LABPROT, INR in the last 72 hours. CMP     Component Value Date/Time   NA 137 12/04/2019 0050   K 3.1 (L) 12/04/2019 0050   CL 98 12/04/2019 0050   CO2 29 12/04/2019 0050   GLUCOSE 100 (H) 12/04/2019 0050   BUN 24 (H) 12/04/2019 0050   CREATININE 1.00 12/04/2019 0050   CALCIUM 11.1 (H) 12/04/2019 0050   PROT 8.1 12/03/2019 1242   ALBUMIN 4.3 12/03/2019 1242   AST 23 12/03/2019 1242   ALT 25 12/03/2019 1242   ALKPHOS 82 12/03/2019 1242   BILITOT 1.2 12/03/2019 1242   GFRNONAA >60 12/04/2019 0050    GFRAA >60 09/26/2016 0513   Lipase     Component Value Date/Time   LIPASE 21 12/03/2019 1242       Studies/Results: CT ABDOMEN PELVIS W CONTRAST  Result Date: 12/03/2019 CLINICAL DATA:  Bowel obstruction, suspected EXAM: CT ABDOMEN AND PELVIS WITH CONTRAST TECHNIQUE: Multidetector CT imaging of the abdomen and pelvis was performed using the standard protocol following bolus administration of intravenous contrast. CONTRAST:  15mL OMNIPAQUE IOHEXOL 300 MG/ML  SOLN COMPARISON:  None. FINDINGS: Lower chest: The visualized heart size within normal limits. No pericardial fluid/thickening. No hiatal hernia. The visualized portions of the lungs are clear. Hepatobiliary: The liver is normal in density without focal abnormality.The main portal vein is patent. The patient is status post cholecystectomy. No biliary ductal dilation. Pancreas: Unremarkable. No pancreatic ductal dilatation or surrounding inflammatory changes. Spleen: Normal in size without focal abnormality. Adrenals/Urinary Tract: Both adrenal glands appear normal. Punctate calcifications seen in the lower pole of the right kidney. Bladder is unremarkable. Stomach/Bowel: The stomach is unremarkable. There is mildly dilated air and fluid-filled loops of small bowel measuring up to 3.4 cm to the level of the mid ileal loops where there is a focal area of narrowing in the remainder of the distal ileal loops appear to be decompressed. Scattered colonic diverticula are noted. Vascular/Lymphatic: There are no enlarged mesenteric, retroperitoneal,  or pelvic lymph nodes. No significant vascular findings are present. Reproductive: The prostate is unremarkable. Other: No evidence of abdominal wall mass or hernia. Musculoskeletal: No acute or significant osseous findings. IMPRESSION: Findings consistent with a small-bowel obstruction to the level of a focal area of narrowing in a distal ileal loop. There is mild wall thickening seen within transition  point of the distal ileal loop which could be due to mild enteritis. Nonobstructing right renal calculus. Electronically Signed   By: Prudencio Pair M.D.   On: 12/03/2019 15:14   DG ABD ACUTE 2+V W 1V CHEST  Result Date: 12/03/2019 CLINICAL DATA:  Upper abdominal pain, nausea, vomiting and diarrhea. EXAM: DG ABDOMEN ACUTE WITH 1 VIEW CHEST COMPARISON:  Chest radiographs dated 06/24/2009. Abdomen and pelvis CT dated 04/20/2014. FINDINGS: Normal sized heart. Tortuous aorta. Clear lungs. Multiple moderately to markedly dilated small bowel loops containing multiple air-fluid levels. No free peritoneal air. Cholecystectomy clips. L5-S1 postoperative changes, including fixation hardware. Lumbar and lower thoracic spine degenerative changes. Mild dextroconvex lumbar scoliosis. IMPRESSION: Small bowel obstruction. These results will be called to the ordering clinician or representative by the Radiologist Assistant, and communication documented in the PACS or [employer]. Electronically Signed   By: Claudie Revering M.D.   On: 12/03/2019 10:25   DG Abd Portable 1V-Small Bowel Protocol-Position Verification  Result Date: 12/03/2019 CLINICAL DATA:  NG tube placement confirmation EXAM: PORTABLE ABDOMEN - 1 VIEW COMPARISON:  12/03/2019 FINDINGS: NG tube coiled in the stomach. Signs of small bowel obstruction with marked dilation of small bowel similar to previous abdominal radiograph. Signs of cholecystectomy and lumbar spine fusion. No acute skeletal process on limited assessment. IMPRESSION: 1. NG tube coiled in the stomach. 2. Signs of small bowel obstruction, similar to prior radiograph. Electronically Signed   By: Zetta Bills M.D.   On: 12/03/2019 17:22    Anti-infectives: Anti-infectives (From admission, onward)   None       Assessment/Plan HTN  SBO - patient with 900 cc recorded out from NGT, bilious - did not get gastrografin overnight for unclear reasons - give gastrografin today, check 8h  film after - mobilize as tolerated - keep K 4.0 and Mg 2.0  FEN: NPO, IVF, NGT to LIWS VTE: ok to have chemical prophylaxis from a surgical standpoint ID: no current abx  LOS: 1 day    Norm Parcel , [facility] Surgery 12/04/2019, 8:25 AM Please see Amion for pager number during day hours 7:00am-4:30pm

## 2019-12-05 ENCOUNTER — Inpatient Hospital Stay (HOSPITAL_COMMUNITY): Payer: Medicare Other

## 2019-12-05 DIAGNOSIS — K56609 Unspecified intestinal obstruction, unspecified as to partial versus complete obstruction: Secondary | ICD-10-CM | POA: Diagnosis not present

## 2019-12-05 LAB — BASIC METABOLIC PANEL
Anion gap: 9 (ref 5–15)
BUN: 18 mg/dL (ref 6–20)
CO2: 29 mmol/L (ref 22–32)
Calcium: 10.4 mg/dL — ABNORMAL HIGH (ref 8.9–10.3)
Chloride: 98 mmol/L (ref 98–111)
Creatinine, Ser: 0.93 mg/dL (ref 0.61–1.24)
GFR, Estimated: >60 mL/min (ref >60)
Glucose, Bld: 97 mg/dL (ref 70–99)
Potassium: 3.4 mmol/L — ABNORMAL LOW (ref 3.5–5.1)
Sodium: 136 mmol/L (ref 135–145)

## 2019-12-05 LAB — CBC
HCT: 42.8 % (ref 39.0–52.0)
Hemoglobin: 14.7 g/dL (ref 13.0–17.0)
MCH: 30.9 pg (ref 26.0–34.0)
MCHC: 34.3 g/dL (ref 30.0–36.0)
MCV: 90.1 fL (ref 80.0–100.0)
Platelets: 385 10*3/uL (ref 150–400)
RBC: 4.75 MIL/uL (ref 4.22–5.81)
RDW: 11.9 % (ref 11.5–15.5)
WBC: 5.6 10*3/uL (ref 4.0–10.5)
nRBC: 0 % (ref 0.0–0.2)

## 2019-12-05 LAB — MAGNESIUM: Magnesium: 2.1 mg/dL (ref 1.7–2.4)

## 2019-12-05 MED ORDER — DOCUSATE SODIUM 100 MG PO CAPS: 100.0000 mg | ORAL_CAPSULE | Freq: Two times a day (BID) | ORAL | 0 refills | Status: AC | PRN

## 2019-12-05 MED ORDER — POTASSIUM CHLORIDE 10 MEQ/100ML IV SOLN
10.0000 meq | INTRAVENOUS | Status: DC
  Administered 2019-12-05 (×2): 10 meq via INTRAVENOUS
  Filled 2019-12-05 (×2): qty 100

## 2019-12-05 MED ORDER — OMEPRAZOLE 40 MG PO CPDR: 40.0000 mg | DELAYED_RELEASE_CAPSULE | Freq: Two times a day (BID) | ORAL | 0 refills | Status: AC

## 2019-12-05 MED ORDER — POTASSIUM CHLORIDE CRYS ER 20 MEQ PO TBCR
40.0000 meq | EXTENDED_RELEASE_TABLET | Freq: Once | ORAL | Status: AC
  Administered 2019-12-05: 40 meq via ORAL
  Filled 2019-12-05: qty 2

## 2019-12-05 NOTE — Progress Notes (Signed)
Subjective/Chief Complaint: Looks good  Having bowel fxn No n/v or abd pain   Objective: Vital signs in last 24 hours: Temp:  [97.5 F (36.4 C)-98.6 F (37 C)] 98.4 F (36.9 C) (11/20 0319) Pulse Rate:  [72-79] 74 (11/20 0319) Resp:  [16-17] 17 (11/20 0319) BP: (152-165)/(80-85) 162/80 (11/20 0319) SpO2:  [95 %-96 %] 96 % (11/20 0319) Last BM Date: 12/05/19  Intake/Output from previous day: No intake/output data recorded. Intake/Output this shift: No intake/output data recorded.  PE:  Constitutional: No acute distress, conversant, appears states age. Eyes: Anicteric sclerae, moist conjunctiva, no lid lag Lungs: Clear to auscultation bilaterally, normal respiratory effort CV: regular rate and rhythm, no murmurs, no peripheral edema, pedal pulses 2+ GI: Soft, no masses or hepatosplenomegaly, non-tender to palpation Skin: No rashes, palpation reveals normal turgor Psychiatric: appropriate judgment and insight, oriented to person, place, and time   Lab Results:  Recent Labs    12/04/19 0050 12/05/19 0306  WBC 5.8 5.6  HGB 15.7 14.7  HCT 46.0 42.8  PLT 376 385   BMET Recent Labs    12/04/19 0050 12/05/19 0306  NA 137 136  K 3.1* 3.4*  CL 98 98  CO2 29 29  GLUCOSE 100* 97  BUN 24* 18  CREATININE 1.00 0.93  CALCIUM 11.1* 10.4*   PT/INR No results for input(s): LABPROT, INR in the last 72 hours. ABG No results for input(s): PHART, HCO3 in the last 72 hours.  Invalid input(s): PCO2, PO2  Studies/Results: CT ABDOMEN PELVIS W CONTRAST  Result Date: 12/03/2019 CLINICAL DATA:  Bowel obstruction, suspected EXAM: CT ABDOMEN AND PELVIS WITH CONTRAST TECHNIQUE: Multidetector CT imaging of the abdomen and pelvis was performed using the standard protocol following bolus administration of intravenous contrast. CONTRAST:  19mL OMNIPAQUE IOHEXOL 300 MG/ML  SOLN COMPARISON:  None. FINDINGS: Lower chest: The visualized heart size within normal limits. No  pericardial fluid/thickening. No hiatal hernia. The visualized portions of the lungs are clear. Hepatobiliary: The liver is normal in density without focal abnormality.The main portal vein is patent. The patient is status post cholecystectomy. No biliary ductal dilation. Pancreas: Unremarkable. No pancreatic ductal dilatation or surrounding inflammatory changes. Spleen: Normal in size without focal abnormality. Adrenals/Urinary Tract: Both adrenal glands appear normal. Punctate calcifications seen in the lower pole of the right kidney. Bladder is unremarkable. Stomach/Bowel: The stomach is unremarkable. There is mildly dilated air and fluid-filled loops of small bowel measuring up to 3.4 cm to the level of the mid ileal loops where there is a focal area of narrowing in the remainder of the distal ileal loops appear to be decompressed. Scattered colonic diverticula are noted. Vascular/Lymphatic: There are no enlarged mesenteric, retroperitoneal, or pelvic lymph nodes. No significant vascular findings are present. Reproductive: The prostate is unremarkable. Other: No evidence of abdominal wall mass or hernia. Musculoskeletal: No acute or significant osseous findings. IMPRESSION: Findings consistent with a small-bowel obstruction to the level of a focal area of narrowing in a distal ileal loop. There is mild wall thickening seen within transition point of the distal ileal loop which could be due to mild enteritis. Nonobstructing right renal calculus. Electronically Signed   By: Prudencio Pair M.D.   On: 12/03/2019 15:14   DG ABD ACUTE 2+V W 1V CHEST  Result Date: 12/03/2019 CLINICAL DATA:  Upper abdominal pain, nausea, vomiting and diarrhea. EXAM: DG ABDOMEN ACUTE WITH 1 VIEW CHEST COMPARISON:  Chest radiographs dated 06/24/2009. Abdomen and pelvis CT dated 04/20/2014. FINDINGS: Normal sized  heart. Tortuous aorta. Clear lungs. Multiple moderately to markedly dilated small bowel loops containing multiple air-fluid  levels. No free peritoneal air. Cholecystectomy clips. L5-S1 postoperative changes, including fixation hardware. Lumbar and lower thoracic spine degenerative changes. Mild dextroconvex lumbar scoliosis. IMPRESSION: Small bowel obstruction. These results will be called to the ordering clinician or representative by the Radiologist Assistant, and communication documented in the PACS or [employer]. Electronically Signed   By: Claudie Revering M.D.   On: 12/03/2019 10:25   DG Abd Portable 1V  Result Date: 12/04/2019 CLINICAL DATA:  Small bowel obstruction. EXAM: PORTABLE ABDOMEN - 1 VIEW COMPARISON:  December 03, 2019 FINDINGS: Contrast now extends through the colon into the sigmoid colon. Dilated loops of small bowel remain in the left side of the abdomen. The distal aspect of the NG tube is in the region of the stomach. No other acute abnormalities. IMPRESSION: 1. Persistent but improving small bowel obstruction. The NG tube is only partially visualized. Electronically Signed   By: Dorise Bullion III M.D   On: 12/04/2019 18:25   DG Abd Portable 1V-Small Bowel Protocol-Position Verification  Result Date: 12/03/2019 CLINICAL DATA:  NG tube placement confirmation EXAM: PORTABLE ABDOMEN - 1 VIEW COMPARISON:  12/03/2019 FINDINGS: NG tube coiled in the stomach. Signs of small bowel obstruction with marked dilation of small bowel similar to previous abdominal radiograph. Signs of cholecystectomy and lumbar spine fusion. No acute skeletal process on limited assessment. IMPRESSION: 1. NG tube coiled in the stomach. 2. Signs of small bowel obstruction, similar to prior radiograph. Electronically Signed   By: Zetta Bills M.D.   On: 12/03/2019 17:22    Anti-infectives: Anti-infectives (From admission, onward)   None      Assessment/Plan: HTN  SBO - resolved Tol clears FEN: adv to soft.  VTE: [name] to have chemical prophylaxis from a surgical standpoint ID: no current abx  Dispo: if tol soft diet  [name] for home later today  LOS: 2 days    Alvin [name] 12/05/2019

## 2019-12-05 NOTE — Progress Notes (Signed)
Alvin [name] to be D/C'd per MD order. Discussed with the patient and all questions fully answered. ? VSS, Skin clean, dry and intact without evidence of skin break down, no evidence of skin tears noted. ? IV catheter discontinued intact. Site without signs and symptoms of complications. Dressing and pressure applied. ? An After Visit Summary was printed and given to the patient. Patient informed where to pickup prescriptions. ? D/c education completed with patient/family including follow up instructions, medication list, d/c activities limitations if indicated, with other d/c instructions as indicated by MD - patient able to verbalize understanding, all questions fully answered.  ? Patient instructed to return to ED, call 911, or call MD for any changes in condition.  ? Patient to be escorted via [place], and D/C home via private auto.

## 2019-12-09 NOTE — Discharge Summary (Signed)
[facility] Discharge Summary   Patient: Alvin [name] ZOX:[id number]  PCP: Leighton Ruff, MD  Date of admission: 12/03/2019   Date of discharge: 12/05/2019     Discharge Diagnoses:   Principal Problem:   SBO (small bowel obstruction) ([place]) Active Problems:   Essential hypertension   DJD (degenerative joint disease)   Hyperglycemia   Admitted From: home Disposition:  Home   Recommendations for Outpatient Follow-up:  1. PCP: please follow up with PCP in 1 week 2. Follow up LABS/TEST:  none   Follow-up Information    Leighton Ruff, MD. Schedule an appointment as soon as possible for a visit in 1 week(s).   Specialty: Family Medicine Contact information: [address] [place] [address] [phone]              Diet recommendation: Cardiac diet  Activity: The patient is advised to gradually reintroduce usual activities, as tolerated  Discharge Condition: stable  Code Status: Full code   History of present illness: As per the H and P dictated on admission, "Alvin [name] is a 59 y.o. male with no significant medical history presenting with n/v since colonoscopy on 11/15.  He had vomiting and diarrhea since colonoscopy with some black spots in it, not sure if this was blood.  His PCP recommended Xray this AM and it appeared to show a bowel blockage and they sent him to the ER.  Colonoscopy was done on Monday.  He started feeling bad the next day.  This was his second C-scope and he did not have problems this time.  He did have a polypectomy x 2 during the procedure.  He feels ok now.  N/V stopped once he got medication in the ER.  Last diarrhea was 2 days ago.    ED Course:  SBO s/p colonoscopy Monday.  Abd pain, n/v following C-scope.  Not passing much gas.  KUB done outpatient with SBO.  H/o cholecystectomy remotely.  Surgery will see, NG tube placed."  Hospital Course:  Summary of his active problems in the hospital is as following. 1.   Small bowel obstruction. General surgery and GI were consulted. NG tube inserted. Significant output from the NG. Surgery initiating Gastrografin contrast. Obstruction resolved. Pt was able to tolerate oral diet.   2.  Essential hypertension Blood pressure stable.   3.  Chronic back pain Monitor for now. Resume home Neurontin and trazodone for now.  4.  Hypokalemia Replaced.  Monitor.  5.  Recent colonoscopy No further work up.   Body mass index is 26.06 kg/m.    Nutrition Interventions:   Patient was ambulatory without any assistance. On the day of the discharge the patient's vitals were stable, and no other acute medical condition were reported by patient. The patient was felt safe to be discharge at Home with no therapy needed on discharge.  Consultants: Gastroenterology General surgery  Procedures: none  Discharge Exam: General: Appear in no distress, no Rash; Oral Mucosa Clear, moist. no Abnormal Neck Mass Or lumps, Conjunctiva normal  Cardiovascular: S1 and S2 Present, no Murmur Respiratory: good respiratory effort, Bilateral Air entry present and CTA, no Crackles, no wheezes Abdomen: Bowel Sound present, Soft and no tenderness Extremities: no Pedal edema Neurology: alert and oriented to time, place, and person affect appropriate. no new focal deficit  Filed Weights   12/03/19 1224  Weight: 92.1 kg   Vitals:   12/05/19 0319 12/05/19 1412  BP: (!) 162/80 (!) 162/87  Pulse: 74 79  Resp:  17 18  Temp: 98.4 F (36.9 C) (!) 97.5 F (36.4 C)  SpO2: 96% 97%    DISCHARGE MEDICATION: Allergies as of 12/05/2019   No Known Allergies     Medication List    STOP taking these medications   hydrochlorothiazide 12.5 MG capsule Commonly known as: MICROZIDE   HYDROcodone-acetaminophen 5-325 MG tablet Commonly known as: NORCO/VICODIN   ibuprofen 800 MG tablet Commonly known as: ADVIL   naproxen 500 MG tablet Commonly known as: NAPROSYN     TAKE these  medications   acetaminophen 325 MG tablet Commonly known as: TYLENOL Take 650 mg by mouth every 6 (six) hours as needed for fever.   docusate sodium 100 MG capsule Commonly known as: Colace Take 1 capsule (100 mg total) by mouth 2 (two) times daily as needed for mild constipation.   gabapentin 300 MG capsule Commonly known as: NEURONTIN Take 300 mg by mouth at bedtime.   lisinopril 40 MG tablet Commonly known as: ZESTRIL Take 40 mg by mouth daily.   omeprazole 40 MG capsule Commonly known as: PRILOSEC Take 1 capsule (40 mg total) by mouth 2 (two) times daily before a meal for 14 days. What changed: when to take this   traZODone 100 MG tablet Commonly known as: DESYREL Take 100 mg by mouth at bedtime as needed for sleep.      No Known Allergies Discharge Instructions    Diet - low sodium heart healthy   Complete by: As directed    Increase activity slowly   Complete by: As directed       The results of significant diagnostics from this hospitalization (including imaging, microbiology, ancillary and laboratory) are listed below for reference.    Significant Diagnostic Studies: CT ABDOMEN PELVIS W CONTRAST  Result Date: 12/03/2019 CLINICAL DATA:  Bowel obstruction, suspected EXAM: CT ABDOMEN AND PELVIS WITH CONTRAST TECHNIQUE: Multidetector CT imaging of the abdomen and pelvis was performed using the standard protocol following bolus administration of intravenous contrast. CONTRAST:  148mL OMNIPAQUE IOHEXOL 300 MG/ML  SOLN COMPARISON:  None. FINDINGS: Lower chest: The visualized heart size within normal limits. No pericardial fluid/thickening. No hiatal hernia. The visualized portions of the lungs are clear. Hepatobiliary: The liver is normal in density without focal abnormality.The main portal vein is patent. The patient is status post cholecystectomy. No biliary ductal dilation. Pancreas: Unremarkable. No pancreatic ductal dilatation or surrounding inflammatory changes.  Spleen: Normal in size without focal abnormality. Adrenals/Urinary Tract: Both adrenal glands appear normal. Punctate calcifications seen in the lower pole of the right kidney. Bladder is unremarkable. Stomach/Bowel: The stomach is unremarkable. There is mildly dilated air and fluid-filled loops of small bowel measuring up to 3.4 cm to the level of the mid ileal loops where there is a focal area of narrowing in the remainder of the distal ileal loops appear to be decompressed. Scattered colonic diverticula are noted. Vascular/Lymphatic: There are no enlarged mesenteric, retroperitoneal, or pelvic lymph nodes. No significant vascular findings are present. Reproductive: The prostate is unremarkable. Other: No evidence of abdominal wall mass or hernia. Musculoskeletal: No acute or significant osseous findings. IMPRESSION: Findings consistent with a small-bowel obstruction to the level of a focal area of narrowing in a distal ileal loop. There is mild wall thickening seen within transition point of the distal ileal loop which could be due to mild enteritis. Nonobstructing right renal calculus. Electronically Signed   By: Prudencio Pair M.D.   On: 12/03/2019 15:14   DG Abd 2 Views  Result Date: 12/05/2019 CLINICAL DATA:  Follow-up small bowel obstruction. EXAM: ABDOMEN - 2 VIEW COMPARISON:  12/04/2019 and prior exams FINDINGS: Dilated gas and fluid-filled small bowel loops within the abdomen are again noted and slightly increased in caliber/distension. Contrast within the RIGHT colon again noted. IMPRESSION: Slightly increased caliber/distension of dilated small bowel loops. Electronically Signed   By: Margarette Canada M.D.   On: 12/05/2019 10:00   DG ABD ACUTE 2+V W 1V CHEST  Result Date: 12/03/2019 CLINICAL DATA:  Upper abdominal pain, nausea, vomiting and diarrhea. EXAM: DG ABDOMEN ACUTE WITH 1 VIEW CHEST COMPARISON:  Chest radiographs dated 06/24/2009. Abdomen and pelvis CT dated 04/20/2014. FINDINGS: Normal sized  heart. Tortuous aorta. Clear lungs. Multiple moderately to markedly dilated small bowel loops containing multiple air-fluid levels. No free peritoneal air. Cholecystectomy clips. L5-S1 postoperative changes, including fixation hardware. Lumbar and lower thoracic spine degenerative changes. Mild dextroconvex lumbar scoliosis. IMPRESSION: Small bowel obstruction. These results will be called to the ordering clinician or representative by the Radiologist Assistant, and communication documented in the PACS or [employer]. Electronically Signed   By: Claudie Revering M.D.   On: 12/03/2019 10:25   DG Abd Portable 1V  Result Date: 12/04/2019 CLINICAL DATA:  Small bowel obstruction. EXAM: PORTABLE ABDOMEN - 1 VIEW COMPARISON:  December 03, 2019 FINDINGS: Contrast now extends through the colon into the sigmoid colon. Dilated loops of small bowel remain in the left side of the abdomen. The distal aspect of the NG tube is in the region of the stomach. No other acute abnormalities. IMPRESSION: 1. Persistent but improving small bowel obstruction. The NG tube is only partially visualized. Electronically Signed   By: Dorise Bullion III M.D   On: 12/04/2019 18:25   DG Abd Portable 1V-Small Bowel Protocol-Position Verification  Result Date: 12/03/2019 CLINICAL DATA:  NG tube placement confirmation EXAM: PORTABLE ABDOMEN - 1 VIEW COMPARISON:  12/03/2019 FINDINGS: NG tube coiled in the stomach. Signs of small bowel obstruction with marked dilation of small bowel similar to previous abdominal radiograph. Signs of cholecystectomy and lumbar spine fusion. No acute skeletal process on limited assessment. IMPRESSION: 1. NG tube coiled in the stomach. 2. Signs of small bowel obstruction, similar to prior radiograph. Electronically Signed   By: Zetta Bills M.D.   On: 12/03/2019 17:22    Microbiology: Recent Results (from the past 240 hour(s))  Respiratory Panel by RT PCR (Flu A&B, Covid) - Nasopharyngeal Swab     Status:  None   Collection Time: 12/03/19  1:17 PM   Specimen: Nasopharyngeal Swab; Nasopharyngeal(NP) swabs in vial transport medium  Result Value Ref Range Status   SARS Coronavirus 2 by RT PCR NEGATIVE NEGATIVE Final    Comment: (NOTE) SARS-CoV-2 target nucleic acids are NOT DETECTED.  The SARS-CoV-2 RNA is generally detectable in upper respiratoy specimens during the acute phase of infection. The lowest concentration of SARS-CoV-2 viral copies this assay can detect is 131 copies/mL. A negative result does not preclude SARS-Cov-2 infection and should not be used as the sole basis for treatment or other patient management decisions. A negative result may occur with  improper specimen collection/handling, submission of specimen other than nasopharyngeal swab, presence of viral mutation(s) within the areas targeted by this assay, and inadequate number of viral copies (<131 copies/mL). A negative result must be combined with clinical observations, patient history, and epidemiological information. The expected result is Negative.  Fact Sheet for Patients:  PinkCheek.be  Fact Sheet for Healthcare Providers:  GravelBags.it  This test is no t yet approved or cleared by the [place] and  has been authorized for detection and/or diagnosis of SARS-CoV-2 by FDA under an Emergency Use Authorization (EUA). This EUA will remain  in effect (meaning this test can be used) for the duration of the COVID-19 declaration under Section 564(b)(1) of the Act, 21 U.S.C. section 360bbb-3(b)(1), unless the authorization is terminated or revoked sooner.     Influenza A by PCR NEGATIVE NEGATIVE Final   Influenza B by PCR NEGATIVE NEGATIVE Final    Comment: (NOTE) The Xpert Xpress SARS-CoV-2/FLU/RSV assay is intended as an aid in  the diagnosis of influenza from Nasopharyngeal swab specimens and  should not be used as a sole basis for treatment.  Nasal washings and  aspirates are unacceptable for Xpert Xpress SARS-CoV-2/FLU/RSV  testing.  Fact Sheet for Patients: PinkCheek.be  Fact Sheet for Healthcare Providers: GravelBags.it  This test is not yet approved or cleared by the [place] FDA and  has been authorized for detection and/or diagnosis of SARS-CoV-2 by  FDA under an Emergency Use Authorization (EUA). This EUA will remain  in effect (meaning this test can be used) for the duration of the  Covid-19 declaration under Section 564(b)(1) of the Act, 21  U.S.C. section 360bbb-3(b)(1), unless the authorization is  terminated or revoked. Performed at [facility], [place] [address], [place], [place] [address]      Labs: CBC: Recent Labs  Lab 12/03/19 1242 12/04/19 0050 12/05/19 0306  WBC 5.4 5.8 5.6  HGB 16.5 15.7 14.7  HCT 48.5 46.0 42.8  MCV 88.8 88.6 90.1  PLT 405* 376 594   Basic Metabolic Panel: Recent Labs  Lab 12/03/19 1242 12/04/19 0050 12/05/19 0306  NA 133* 137 136  K 3.2* 3.1* 3.4*  CL 95* 98 98  CO2 24 29 29   GLUCOSE 133* 100* 97  BUN 28* 24* 18  CREATININE 1.11 1.00 0.93  CALCIUM 11.1* 11.1* 10.4*  MG  --  2.0 2.1   Liver Function Tests: Recent Labs  Lab 12/03/19 1242  AST 23  ALT 25  ALKPHOS 82  BILITOT 1.2  PROT 8.1  ALBUMIN 4.3   CBG: No results for input(s): GLUCAP in the last 168 hours.  Time spent: 35 minutes  Signed:  Berle Mull  [facility] 12/05/2019

## 2021-02-15 DIAGNOSIS — I10 Essential (primary) hypertension: Secondary | ICD-10-CM | POA: Diagnosis not present

## 2021-02-19 DIAGNOSIS — G4733 Obstructive sleep apnea (adult) (pediatric): Secondary | ICD-10-CM | POA: Diagnosis not present

## 2021-04-17 IMAGING — CR DG ABDOMEN ACUTE W/ 1V CHEST
3 series · 3 of 3 positions shown · non-contrast
Comparison: Chest radiographs dated 06/24/2009. Abdomen and pelvis
CT dated 04/20/2014.

CLINICAL DATA: Upper abdominal pain, nausea, vomiting and diarrhea.

EXAM:
DG ABDOMEN ACUTE WITH 1 VIEW CHEST

[w chest pa]
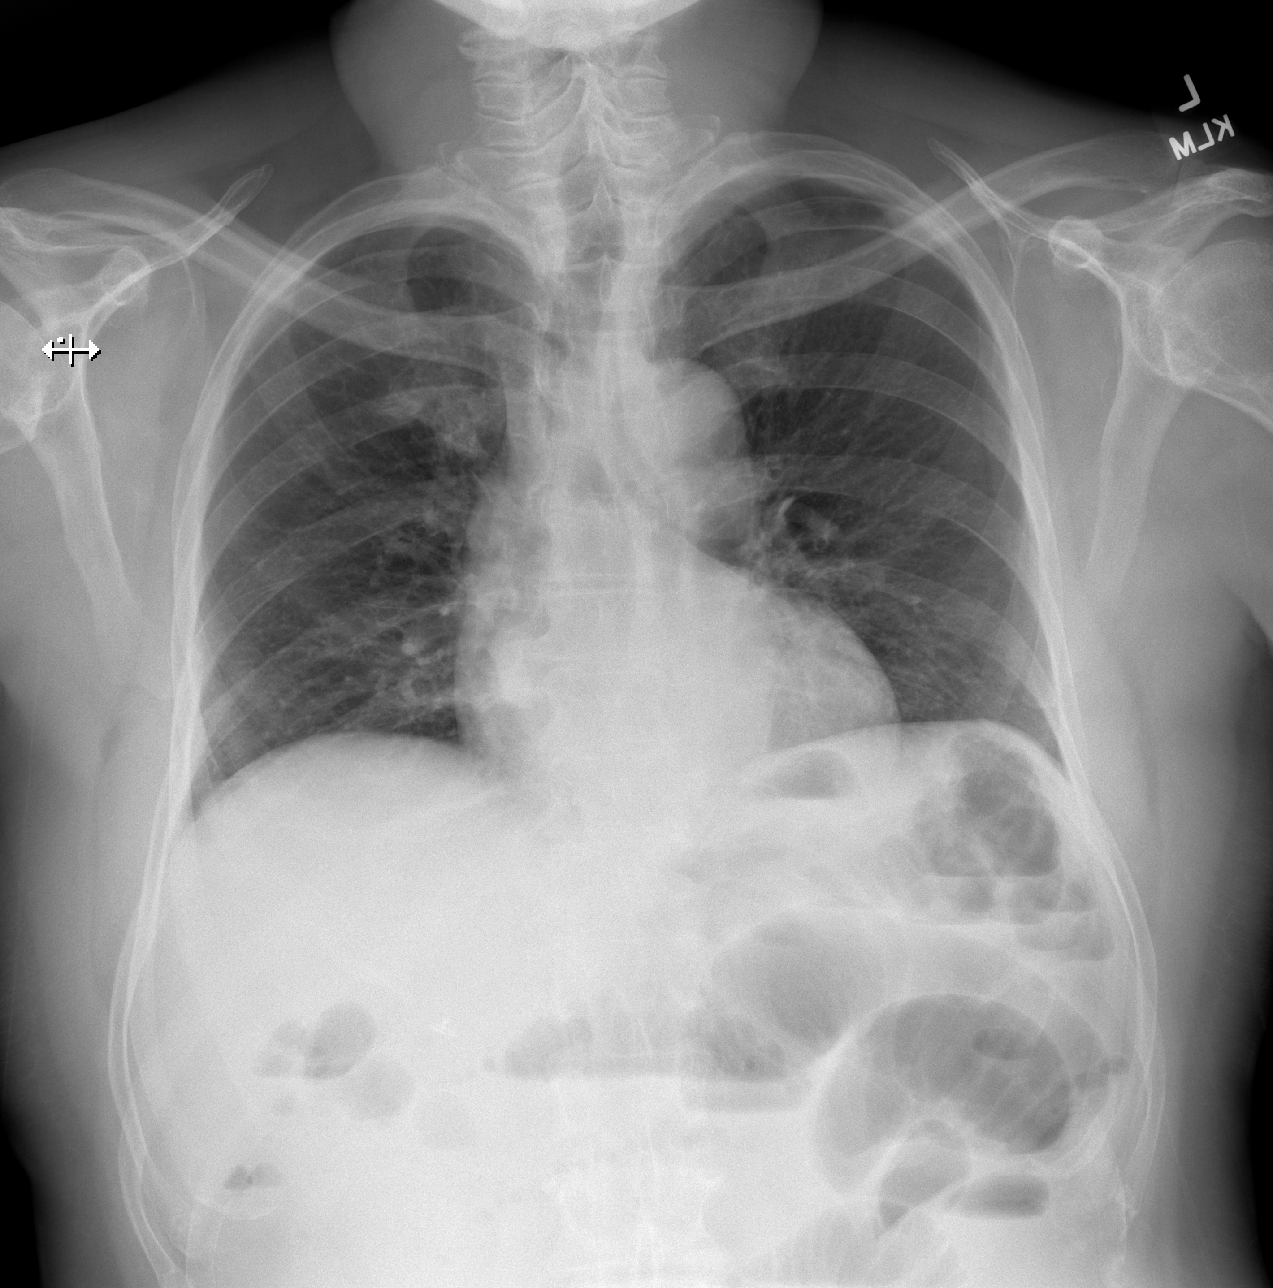

[w abdomen upright]
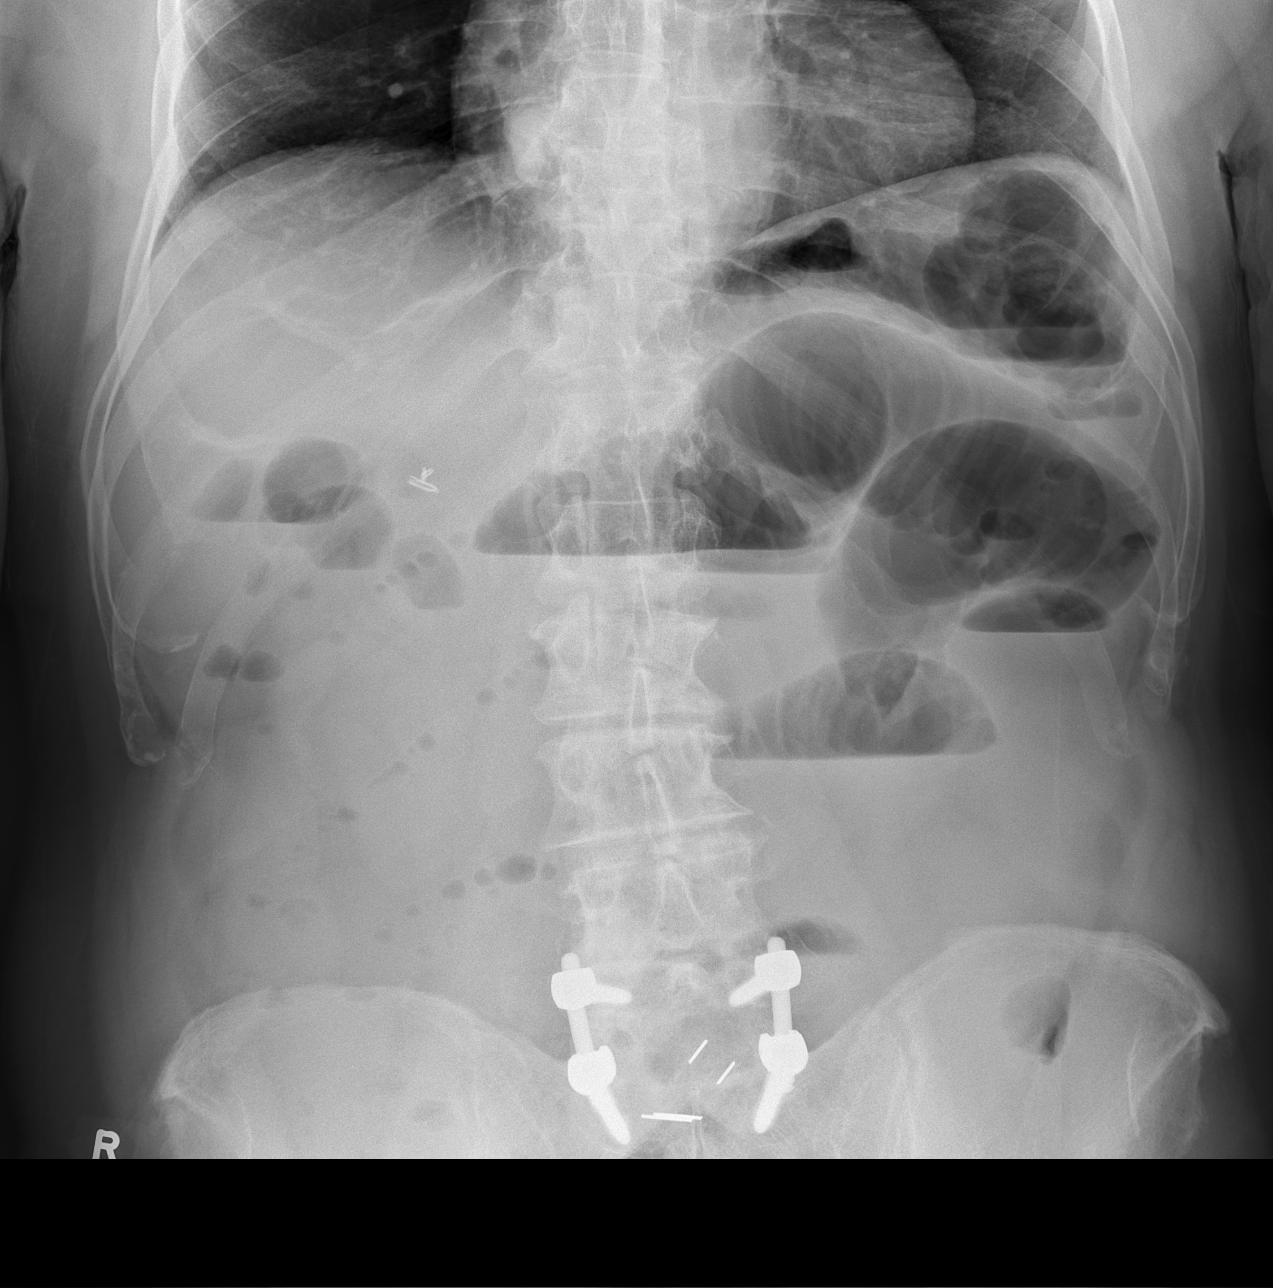

[t abdomen supine]
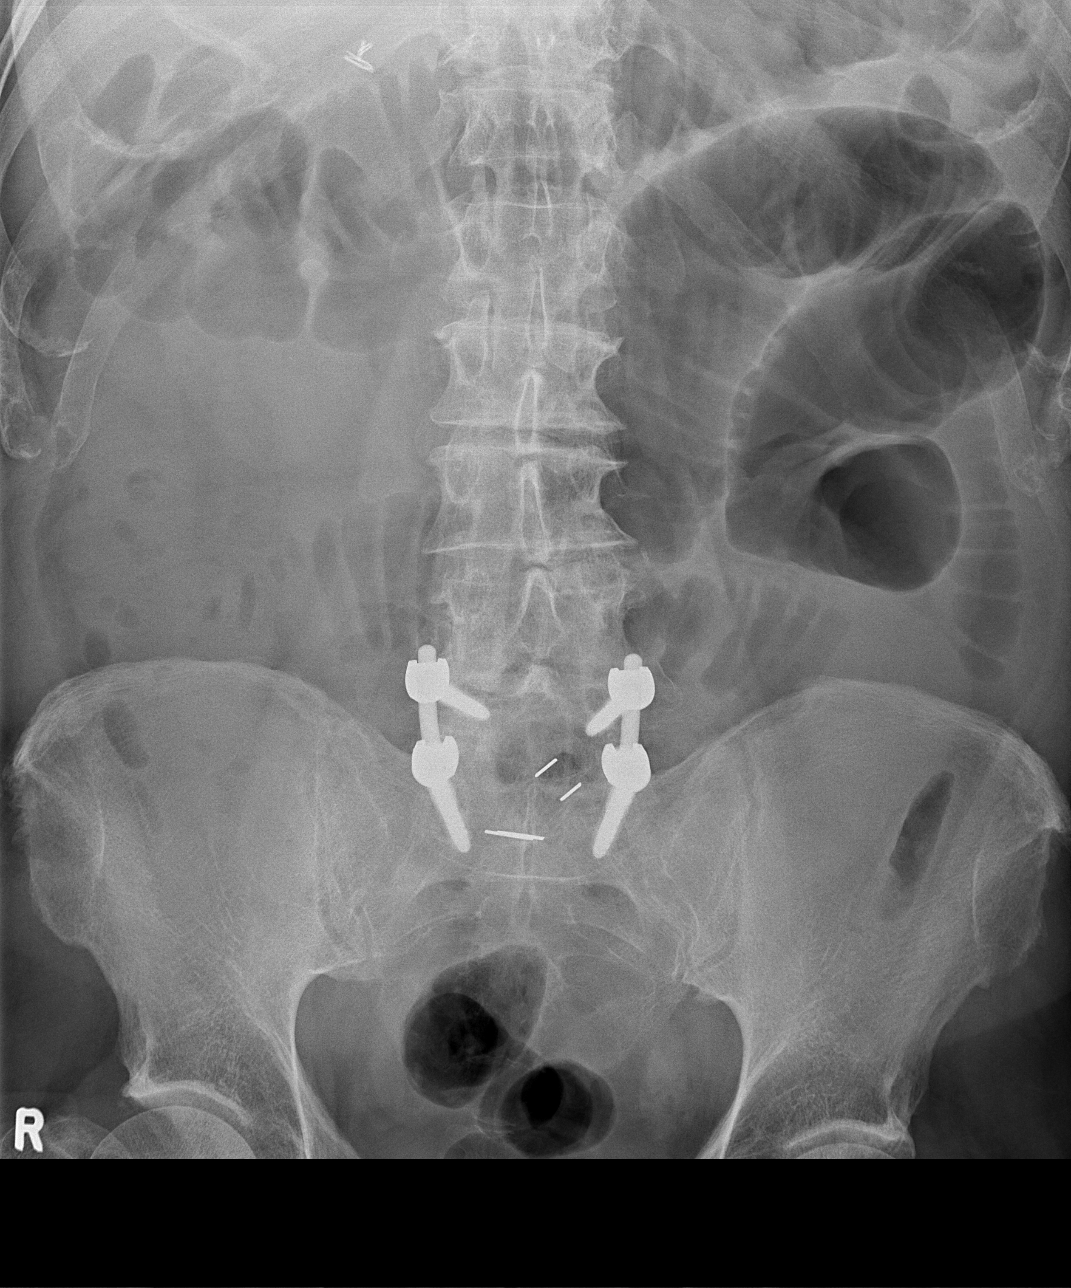

[3 of 3 positions shown; findings below may reference images not displayed]

FINDINGS: Normal sized heart. Tortuous aorta. Clear lungs.

Multiple moderately to markedly dilated small bowel loops containing
multiple air-fluid levels. No free peritoneal air. Cholecystectomy
clips.

L5-S1 postoperative changes, including fixation hardware. Lumbar and
lower thoracic spine degenerative changes. Mild dextroconvex lumbar
scoliosis.
IMPRESSION: Small bowel obstruction.

These results will be called to the ordering clinician or
representative by the Radiologist Assistant, and communication
documented in the PACS or [REDACTED].

## 2021-04-18 DIAGNOSIS — I10 Essential (primary) hypertension: Secondary | ICD-10-CM | POA: Diagnosis not present

## 2021-05-01 DIAGNOSIS — G4733 Obstructive sleep apnea (adult) (pediatric): Secondary | ICD-10-CM | POA: Diagnosis not present

## 2021-05-25 DIAGNOSIS — G4733 Obstructive sleep apnea (adult) (pediatric): Secondary | ICD-10-CM | POA: Diagnosis not present

## 2021-06-11 DIAGNOSIS — H109 Unspecified conjunctivitis: Secondary | ICD-10-CM | POA: Diagnosis not present

## 2021-06-13 DIAGNOSIS — H11433 Conjunctival hyperemia, bilateral: Secondary | ICD-10-CM | POA: Diagnosis not present

## 2021-06-13 DIAGNOSIS — H10023 Other mucopurulent conjunctivitis, bilateral: Secondary | ICD-10-CM | POA: Diagnosis not present

## 2021-07-04 DIAGNOSIS — G4733 Obstructive sleep apnea (adult) (pediatric): Secondary | ICD-10-CM | POA: Diagnosis not present

## 2021-08-09 DIAGNOSIS — G4733 Obstructive sleep apnea (adult) (pediatric): Secondary | ICD-10-CM | POA: Diagnosis not present

## 2021-09-05 DIAGNOSIS — E7800 Pure hypercholesterolemia, unspecified: Secondary | ICD-10-CM | POA: Diagnosis not present

## 2021-09-05 DIAGNOSIS — E559 Vitamin D deficiency, unspecified: Secondary | ICD-10-CM | POA: Diagnosis not present

## 2021-09-05 DIAGNOSIS — K219 Gastro-esophageal reflux disease without esophagitis: Secondary | ICD-10-CM | POA: Diagnosis not present

## 2021-09-05 DIAGNOSIS — I10 Essential (primary) hypertension: Secondary | ICD-10-CM | POA: Diagnosis not present

## 2021-09-05 DIAGNOSIS — E210 Primary hyperparathyroidism: Secondary | ICD-10-CM | POA: Diagnosis not present

## 2021-10-25 DIAGNOSIS — E349 Endocrine disorder, unspecified: Secondary | ICD-10-CM | POA: Diagnosis not present

## 2021-11-20 DIAGNOSIS — M47896 Other spondylosis, lumbar region: Secondary | ICD-10-CM | POA: Diagnosis not present

## 2021-11-20 DIAGNOSIS — M4326 Fusion of spine, lumbar region: Secondary | ICD-10-CM | POA: Diagnosis not present

## 2021-11-20 DIAGNOSIS — M4726 Other spondylosis with radiculopathy, lumbar region: Secondary | ICD-10-CM | POA: Diagnosis not present

## 2021-11-20 DIAGNOSIS — M961 Postlaminectomy syndrome, not elsewhere classified: Secondary | ICD-10-CM | POA: Diagnosis not present

## 2022-02-15 DIAGNOSIS — H40053 Ocular hypertension, bilateral: Secondary | ICD-10-CM | POA: Diagnosis not present

## 2022-03-08 DIAGNOSIS — G4733 Obstructive sleep apnea (adult) (pediatric): Secondary | ICD-10-CM | POA: Diagnosis not present

## 2022-03-09 DIAGNOSIS — G4733 Obstructive sleep apnea (adult) (pediatric): Secondary | ICD-10-CM | POA: Diagnosis not present

## 2022-03-13 DIAGNOSIS — L819 Disorder of pigmentation, unspecified: Secondary | ICD-10-CM | POA: Diagnosis not present

## 2022-03-13 DIAGNOSIS — L578 Other skin changes due to chronic exposure to nonionizing radiation: Secondary | ICD-10-CM | POA: Diagnosis not present

## 2022-03-19 DIAGNOSIS — G4733 Obstructive sleep apnea (adult) (pediatric): Secondary | ICD-10-CM | POA: Diagnosis not present

## 2022-09-18 DIAGNOSIS — K219 Gastro-esophageal reflux disease without esophagitis: Secondary | ICD-10-CM | POA: Diagnosis not present

## 2022-09-18 DIAGNOSIS — I10 Essential (primary) hypertension: Secondary | ICD-10-CM | POA: Diagnosis not present

## 2022-09-18 DIAGNOSIS — E210 Primary hyperparathyroidism: Secondary | ICD-10-CM | POA: Diagnosis not present

## 2022-09-18 DIAGNOSIS — E559 Vitamin D deficiency, unspecified: Secondary | ICD-10-CM | POA: Diagnosis not present

## 2022-09-18 DIAGNOSIS — E7800 Pure hypercholesterolemia, unspecified: Secondary | ICD-10-CM | POA: Diagnosis not present

## 2022-10-15 DIAGNOSIS — E7800 Pure hypercholesterolemia, unspecified: Secondary | ICD-10-CM | POA: Diagnosis not present

## 2022-10-15 DIAGNOSIS — I10 Essential (primary) hypertension: Secondary | ICD-10-CM | POA: Diagnosis not present

## 2022-10-15 DIAGNOSIS — E210 Primary hyperparathyroidism: Secondary | ICD-10-CM | POA: Diagnosis not present

## 2022-11-15 DIAGNOSIS — R946 Abnormal results of thyroid function studies: Secondary | ICD-10-CM | POA: Diagnosis not present

## 2022-12-18 DIAGNOSIS — E785 Hyperlipidemia, unspecified: Secondary | ICD-10-CM | POA: Diagnosis not present

## 2022-12-18 DIAGNOSIS — I10 Essential (primary) hypertension: Secondary | ICD-10-CM | POA: Diagnosis not present

## 2022-12-18 DIAGNOSIS — G4733 Obstructive sleep apnea (adult) (pediatric): Secondary | ICD-10-CM | POA: Diagnosis not present

## 2023-01-28 DIAGNOSIS — E785 Hyperlipidemia, unspecified: Secondary | ICD-10-CM | POA: Diagnosis not present

## 2023-01-28 DIAGNOSIS — G4733 Obstructive sleep apnea (adult) (pediatric): Secondary | ICD-10-CM | POA: Diagnosis not present

## 2023-01-28 DIAGNOSIS — I10 Essential (primary) hypertension: Secondary | ICD-10-CM | POA: Diagnosis not present

## 2023-01-28 DIAGNOSIS — M5186 Other intervertebral disc disorders, lumbar region: Secondary | ICD-10-CM | POA: Diagnosis not present

## 2023-02-14 DIAGNOSIS — I10 Essential (primary) hypertension: Secondary | ICD-10-CM | POA: Diagnosis not present

## 2023-02-14 DIAGNOSIS — M7989 Other specified soft tissue disorders: Secondary | ICD-10-CM | POA: Diagnosis not present

## 2023-02-19 DIAGNOSIS — H40053 Ocular hypertension, bilateral: Secondary | ICD-10-CM | POA: Diagnosis not present

## 2023-04-12 DIAGNOSIS — M25511 Pain in right shoulder: Secondary | ICD-10-CM | POA: Diagnosis not present

## 2023-04-12 DIAGNOSIS — I10 Essential (primary) hypertension: Secondary | ICD-10-CM | POA: Diagnosis not present

## 2023-04-15 DIAGNOSIS — G4733 Obstructive sleep apnea (adult) (pediatric): Secondary | ICD-10-CM | POA: Diagnosis not present

## 2023-04-15 DIAGNOSIS — E785 Hyperlipidemia, unspecified: Secondary | ICD-10-CM | POA: Diagnosis not present

## 2023-04-15 DIAGNOSIS — I10 Essential (primary) hypertension: Secondary | ICD-10-CM | POA: Diagnosis not present

## 2023-04-22 DIAGNOSIS — M25511 Pain in right shoulder: Secondary | ICD-10-CM | POA: Diagnosis not present

## 2023-05-09 DIAGNOSIS — M25511 Pain in right shoulder: Secondary | ICD-10-CM | POA: Diagnosis not present

## 2023-05-21 ENCOUNTER — Ambulatory Visit: Payer: Medicare Other | Admitting: Endocrinology

## 2023-06-13 DIAGNOSIS — G4733 Obstructive sleep apnea (adult) (pediatric): Secondary | ICD-10-CM | POA: Diagnosis not present

## 2023-06-20 DIAGNOSIS — I10 Essential (primary) hypertension: Secondary | ICD-10-CM | POA: Diagnosis not present

## 2023-06-20 DIAGNOSIS — Z789 Other specified health status: Secondary | ICD-10-CM | POA: Diagnosis not present

## 2023-06-20 DIAGNOSIS — G4733 Obstructive sleep apnea (adult) (pediatric): Secondary | ICD-10-CM | POA: Diagnosis not present

## 2023-06-21 DIAGNOSIS — G4733 Obstructive sleep apnea (adult) (pediatric): Secondary | ICD-10-CM | POA: Diagnosis not present

## 2023-07-15 DIAGNOSIS — I10 Essential (primary) hypertension: Secondary | ICD-10-CM | POA: Diagnosis not present

## 2023-07-15 DIAGNOSIS — E7800 Pure hypercholesterolemia, unspecified: Secondary | ICD-10-CM | POA: Diagnosis not present

## 2023-07-15 DIAGNOSIS — E785 Hyperlipidemia, unspecified: Secondary | ICD-10-CM | POA: Diagnosis not present

## 2023-08-02 DIAGNOSIS — G4733 Obstructive sleep apnea (adult) (pediatric): Secondary | ICD-10-CM | POA: Diagnosis not present

## 2023-08-02 DIAGNOSIS — M48062 Spinal stenosis, lumbar region with neurogenic claudication: Secondary | ICD-10-CM | POA: Diagnosis not present

## 2023-08-02 DIAGNOSIS — Z789 Other specified health status: Secondary | ICD-10-CM | POA: Diagnosis not present

## 2023-08-15 DIAGNOSIS — E7800 Pure hypercholesterolemia, unspecified: Secondary | ICD-10-CM | POA: Diagnosis not present

## 2023-08-15 DIAGNOSIS — E785 Hyperlipidemia, unspecified: Secondary | ICD-10-CM | POA: Diagnosis not present

## 2023-08-15 DIAGNOSIS — I10 Essential (primary) hypertension: Secondary | ICD-10-CM | POA: Diagnosis not present

## 2023-09-15 DIAGNOSIS — E785 Hyperlipidemia, unspecified: Secondary | ICD-10-CM | POA: Diagnosis not present

## 2023-09-15 DIAGNOSIS — E7800 Pure hypercholesterolemia, unspecified: Secondary | ICD-10-CM | POA: Diagnosis not present

## 2023-09-15 DIAGNOSIS — I10 Essential (primary) hypertension: Secondary | ICD-10-CM | POA: Diagnosis not present

## 2023-09-27 DIAGNOSIS — Z23 Encounter for immunization: Secondary | ICD-10-CM | POA: Diagnosis not present

## 2023-10-02 DIAGNOSIS — Z9989 Dependence on other enabling machines and devices: Secondary | ICD-10-CM | POA: Diagnosis not present

## 2023-10-02 DIAGNOSIS — I10 Essential (primary) hypertension: Secondary | ICD-10-CM | POA: Diagnosis not present

## 2023-10-02 DIAGNOSIS — M48062 Spinal stenosis, lumbar region with neurogenic claudication: Secondary | ICD-10-CM | POA: Diagnosis not present

## 2023-10-02 DIAGNOSIS — Z789 Other specified health status: Secondary | ICD-10-CM | POA: Diagnosis not present

## 2023-10-02 DIAGNOSIS — G4733 Obstructive sleep apnea (adult) (pediatric): Secondary | ICD-10-CM | POA: Diagnosis not present

## 2023-10-07 DIAGNOSIS — E210 Primary hyperparathyroidism: Secondary | ICD-10-CM | POA: Diagnosis not present

## 2023-10-07 DIAGNOSIS — M5187 Other intervertebral disc disorders, lumbosacral region: Secondary | ICD-10-CM | POA: Diagnosis not present

## 2023-10-07 DIAGNOSIS — E559 Vitamin D deficiency, unspecified: Secondary | ICD-10-CM | POA: Diagnosis not present

## 2023-10-07 DIAGNOSIS — I10 Essential (primary) hypertension: Secondary | ICD-10-CM | POA: Diagnosis not present

## 2023-10-07 DIAGNOSIS — E7800 Pure hypercholesterolemia, unspecified: Secondary | ICD-10-CM | POA: Diagnosis not present

## 2023-10-07 DIAGNOSIS — M5440 Lumbago with sciatica, unspecified side: Secondary | ICD-10-CM | POA: Diagnosis not present

## 2023-10-07 DIAGNOSIS — Z0000 Encounter for general adult medical examination without abnormal findings: Secondary | ICD-10-CM | POA: Diagnosis not present

## 2023-10-08 DIAGNOSIS — G4733 Obstructive sleep apnea (adult) (pediatric): Secondary | ICD-10-CM | POA: Diagnosis not present

## 2024-01-21 NOTE — Progress Notes (Deleted)
 " Cardiology Office Note:    Date:  01/21/2024   ID:  Demaurion Dicioccio, DOB 1960-05-24, MRN [id number]  PCP:  [name], MD  Cardiologist:  None  Electrophysiologist:  None   Referring MD: [name], PA-C   No chief complaint on file. ***  History of Present Illness:    Alvin [name] is a 64 y.o. male with a hx of hypertension who was referred by Pfeiffer Katina, PA for evaluation of hypertension and palpitations.  Past Medical History:  Diagnosis Date   DJD (degenerative joint disease)    on disability   Essential hypertension    Gallstones    scan 3 months ago per pt   Lipoma    left thigh    Past Surgical History:  Procedure Laterality Date   BACK SURGERY  2014   lower back, screws and rod   CHOLECYSTECTOMY N/A 09/25/2016   Procedure: LAPAROSCOPIC CHOLECYSTECTOMY WITH INTRAOPERATIVE CHOLANGIOGRAM;  Surgeon: Gladis Cough, MD;  Location: [facility];  Service: General;  Laterality: N/A;   LIPOMA EXCISION Left 12/27/2014   Procedure: EXCISION OF LEFT THIGH LIPOMA;  Surgeon: Cough Car, MD;  Location: [facility];  Service: Orthopedics;  Laterality: Left;   right leg benign tumor removed  7-8 yrs ago    Current Medications: Active Medications[1]   Allergies:   Patient has no known allergies.   Social History   Socioeconomic History   Marital status: Married    Spouse name: Not on file   Number of children: Not on file   Years of education: Not on file   Highest education level: Not on file  Occupational History   Occupation: disabled  Tobacco Use   Smoking status: Former    Current packs/day: 0.25    Average packs/day: 0.3 packs/day for 10.0 years (2.5 ttl pk-yrs)    Types: Cigarettes   Smokeless tobacco: Never   Tobacco comments:    very remote  Substance and Sexual Activity   Alcohol use: Yes    Comment: occasional   Drug use: No   Sexual activity: Not on file  Other Topics Concern   Not on file  Social History Narrative   Not on file    Social Drivers of Health   Tobacco Use: Medium Risk (01/17/2024)   Received from [facility]   Patient History    Smoking Tobacco Use: Former    Smokeless Tobacco Use: Never    Passive Exposure: Not on [place] Strain: Not on file  Food Insecurity: Not on file  Transportation Needs: Not on file  Physical Activity: Not on file  Stress: Not on file  Social Connections: Not on file  Depression (EYV7-0): Not on file  Alcohol Screen: Not on file  Housing: Not on file  Utilities: Not on file  Health Literacy: Not on file     Family History: The patient's ***family history is not on file.  ROS:   Please see the history of present illness.    *** All other systems reviewed and are negative.  EKGs/Labs/Other Studies Reviewed:    The following studies were reviewed today: ***  EKG:  EKG is *** ordered today.  The ekg ordered today demonstrates ***  Recent Labs: No results found for requested labs within last 365 days.  Recent Lipid Panel No results found for: CHOL, TRIG, HDL, CHOLHDL, VLDL, LDLCALC, LDLDIRECT  Physical Exam:    VS:  There were no vitals taken for this visit.    Wt Readings  from Last 3 Encounters:  12/03/19 203 lb (92.1 kg)  09/24/16 209 lb (94.8 kg)  12/27/14 210 lb 14 oz (95.7 kg)     GEN: *** Well nourished, well developed in no acute distress HEENT: Normal NECK: No JVD; No carotid bruits LYMPHATICS: No lymphadenopathy CARDIAC: ***RRR, no murmurs, rubs, gallops RESPIRATORY:  Clear to auscultation without rales, wheezing or rhonchi  ABDOMEN: Soft, non-tender, non-distended MUSCULOSKELETAL:  No edema; No deformity  SKIN: Warm and dry NEUROLOGIC:  Alert and oriented x 3 PSYCHIATRIC:  Normal affect   ASSESSMENT:    No diagnosis found. PLAN:    Palpitations:  Hypertension: On amlodipine -valsartan  5-320 mg daily  Hyperlipidemia: On rosuvastatin 5 mg daily  RTC in***  Medication Adjustments/Labs and Tests  Ordered: Current medicines are reviewed at length with the patient today.  Concerns regarding medicines are outlined above.  No orders of the defined types were placed in this encounter.  No orders of the defined types were placed in this encounter.   There are no Patient Instructions on file for this visit.   Signed, Lonni LITTIE Nanas, MD  01/21/2024 9:06 PM    [facility]    [1]  No outpatient medications have been marked as taking for the 01/22/24 encounter (Appointment) with Nanas Lonni LITTIE, MD.   "

## 2024-01-22 ENCOUNTER — Ambulatory Visit: Admitting: Cardiology

## 2024-01-22 ENCOUNTER — Ambulatory Visit

## 2024-01-22 ENCOUNTER — Other Ambulatory Visit (HOSPITAL_COMMUNITY): Payer: Self-pay

## 2024-01-22 VITALS — BP 144/78 | HR 62 | Ht 74.4 in | Wt 209.4 lb

## 2024-01-22 DIAGNOSIS — R9431 Abnormal electrocardiogram [ECG] [EKG]: Secondary | ICD-10-CM | POA: Diagnosis not present

## 2024-01-22 DIAGNOSIS — I10 Essential (primary) hypertension: Secondary | ICD-10-CM | POA: Diagnosis not present

## 2024-01-22 DIAGNOSIS — E785 Hyperlipidemia, unspecified: Secondary | ICD-10-CM

## 2024-01-22 DIAGNOSIS — R002 Palpitations: Secondary | ICD-10-CM

## 2024-01-22 MED ORDER — AMLODIPINE BESYLATE 10 MG PO TABS: 10.0000 mg | ORAL_TABLET | Freq: Every day | ORAL | 0 refills | Status: AC

## 2024-01-22 MED ORDER — AMLODIPINE BESYLATE 10 MG PO TABS: 10.0000 mg | ORAL_TABLET | Freq: Every day | ORAL | 2 refills | Status: AC

## 2024-01-22 MED ORDER — VALSARTAN 320 MG PO TABS: 320.0000 mg | ORAL_TABLET | Freq: Every day | ORAL | 2 refills | Status: AC

## 2024-01-22 MED ORDER — VALSARTAN 320 MG PO TABS
320.0000 mg | ORAL_TABLET | Freq: Every day | ORAL | 0 refills | Status: DC
  Filled 2024-01-22: qty 90, 90d supply, fill #0

## 2024-01-22 MED ORDER — VALSARTAN 320 MG PO TABS: 320.0000 mg | ORAL_TABLET | Freq: Every day | ORAL | 0 refills | Status: AC

## 2024-01-22 MED ORDER — AMLODIPINE BESYLATE 10 MG PO TABS
10.0000 mg | ORAL_TABLET | Freq: Every day | ORAL | 0 refills | Status: DC
  Filled 2024-01-22: qty 90, 90d supply, fill #0

## 2024-01-22 NOTE — Progress Notes (Signed)
 " Cardiology Office Note:    Date:  01/22/2024   ID:  Alvin [name], DOB 02-May-1960, MRN [id number]  PCP:  Alvin Norris, MD  Cardiologist:  None  Electrophysiologist:  None   Referring MD: Alvin Pfeiffer, PA-C   Chief Complaint  Patient presents with   Hypertension    History of Present Illness:    Alvin [name] is a 64 y.o. male with a hx of hypertension who was referred by [name] Katina, PA for evaluation of hypertension and palpitations.  He denies any chest pain or dyspnea.  Does report having some lightheadedness when BP is high.  Does report lower extremity edema.  He reports palpitations where he feels like heart is racing, was happening about 1-2 times per week but more recently about 1-2 times per month.  Can last for 30 to 45 minutes.  He walks 2 miles 5-6 times per week, denies exertional symptoms.  Has not been checking BP at home but reports just got a BP monitor and will start checking.  He smoked for 12 years about 0.2 packs/day, quit in early 30s.  Family history includes father had CVA in 76s.    BP Readings from Last 3 Encounters:  01/22/24 (!) 144/78  12/05/19 (!) 162/87  09/26/16 (!) 149/78      Past Medical History:  Diagnosis Date   DJD (degenerative joint disease)    on disability   Essential hypertension    Gallstones    scan 3 months ago per pt   Lipoma    left thigh    Past Surgical History:  Procedure Laterality Date   BACK SURGERY  2014   lower back, screws and rod   CHOLECYSTECTOMY N/A 09/25/2016   Procedure: LAPAROSCOPIC CHOLECYSTECTOMY WITH INTRAOPERATIVE CHOLANGIOGRAM;  Surgeon: Gladis Cough, MD;  Location: [facility];  Service: General;  Laterality: N/A;   LIPOMA EXCISION Left 12/27/2014   Procedure: EXCISION OF LEFT THIGH LIPOMA;  Surgeon: Cough Car, MD;  Location: [facility];  Service: Orthopedics;  Laterality: Left;   right leg benign tumor removed  7-8 yrs ago    Current Medications: Active Medications[1]   Allergies:    Patient has no known allergies.   Social History   Socioeconomic History   Marital status: Married    Spouse name: Not on file   Number of children: Not on file   Years of education: Not on file   Highest education level: Not on file  Occupational History   Occupation: disabled  Tobacco Use   Smoking status: Former    Current packs/day: 0.25    Average packs/day: 0.3 packs/day for 10.0 years (2.5 ttl pk-yrs)    Types: Cigarettes   Smokeless tobacco: Never   Tobacco comments:    very remote  Substance and Sexual Activity   Alcohol use: Yes    Comment: occasional   Drug use: No   Sexual activity: Not on file  Other Topics Concern   Not on file  Social History Narrative   Not on file   Social Drivers of Health   Tobacco Use: Medium Risk (01/17/2024)   Received from [facility]   Patient History    Smoking Tobacco Use: Former    Smokeless Tobacco Use: Never    Passive Exposure: Not on Actuary Strain: Not on file  Food Insecurity: Not on file  Transportation Needs: Not on file  Physical Activity: Not on file  Stress: Not on file  Social Connections: Not on file  Depression (PHQ2-9): Not on file  Alcohol Screen: Not on file  Housing: Not on file  Utilities: Not on file  Health Literacy: Not on file     Family History: The patient's family history is not on file.  ROS:   Please see the history of present illness.     All other systems reviewed and are negative.  EKGs/Labs/Other Studies Reviewed:    The following studies were reviewed today:   EKG:   01/22/2024: Normal sinus rhythm, rate 62, LVH, T wave inversions in leads III, aVF, V3-4  Recent Labs: No results found for requested labs within last 365 days.  Recent Lipid Panel No results found for: CHOL, TRIG, HDL, CHOLHDL, VLDL, LDLCALC, LDLDIRECT  Physical Exam:    VS:  BP (!) 144/78 (BP Location: Right Arm, Patient Position: Sitting, Cuff Size: Normal)   Pulse 62    Ht 6' 2.4 (1.89 m)   Wt 209 lb 6.4 oz (95 kg)   SpO2 94%   BMI 26.60 kg/m     Wt Readings from Last 3 Encounters:  01/22/24 209 lb 6.4 oz (95 kg)  12/03/19 203 lb (92.1 kg)  09/24/16 209 lb (94.8 kg)     GEN:  Well nourished, well developed in no acute distress HEENT: Normal NECK: No JVD; No carotid bruits LYMPHATICS: No lymphadenopathy CARDIAC: RRR, no murmurs, rubs, gallops RESPIRATORY:  Clear to auscultation without rales, wheezing or rhonchi  ABDOMEN: Soft, non-tender, non-distended MUSCULOSKELETAL:  No edema; No deformity  SKIN: Warm and dry NEUROLOGIC:  Alert and oriented x 3 PSYCHIATRIC:  Normal affect   ASSESSMENT:    1. Palpitations   2. Essential hypertension   3. Abnormal EKG   4. Hyperlipidemia, unspecified hyperlipidemia type    PLAN:    Palpitations: Description concerning for arrhythmia, evaluate with Zio patch x 2 weeks  Abnormal EKG: LVH with repolarization abnormalities, will check echocardiogram  Hypertension: On amlodipine -valsartan  5-320 mg daily.  BP remains elevated, will switch to valsartan  320 mg and amlodipine  10 mg daily.  Asked to check BP daily for twice daily for next 2 weeks and let us  know results.  Suspect untreated OSA contributing to his hypertension and and will see improvement with starting Inspire  Hyperlipidemia: On rosuvastatin 5 mg daily.  LDL 91 on 09/2023  OSA: s/p Inspire device, just activated on Friday  RTC in 3 months  Medication Adjustments/Labs and Tests Ordered: Current medicines are reviewed at length with the patient today.  Concerns regarding medicines are outlined above.  Orders Placed This Encounter  Procedures   LONG TERM MONITOR (3-14 DAYS)   EKG 12-Lead   ECHOCARDIOGRAM COMPLETE   Meds ordered this encounter  Medications   DISCONTD: amLODipine  (NORVASC ) 10 MG tablet    Sig: Take 1 tablet (10 mg total) by mouth daily.    Dispense:  90 tablet    Refill:  0   DISCONTD: valsartan  (DIOVAN ) 320 MG tablet     Sig: Take 1 tablet (320 mg total) by mouth daily.    Dispense:  90 tablet    Refill:  0   amLODipine  (NORVASC ) 10 MG tablet    Sig: Take 1 tablet (10 mg total) by mouth daily.    Dispense:  90 tablet    Refill:  2   valsartan  (DIOVAN ) 320 MG tablet    Sig: Take 1 tablet (320 mg total) by mouth daily.    Dispense:  90 tablet    Refill:  2   amLODipine  (  NORVASC ) 10 MG tablet    Sig: Take 1 tablet (10 mg total) by mouth daily.    Dispense:  90 tablet    Refill:  0   valsartan  (DIOVAN ) 320 MG tablet    Sig: Take 1 tablet (320 mg total) by mouth daily.    Dispense:  90 tablet    Refill:  0    Patient Instructions  Medication Instructions:  Start taking Valsartan  320 mg daily Start Amlodipine  10mg  daily Stop Amlodipine - Valsartan  combination pill as directed by your provide Your physician recommends that you continue on your current medications as directed. Please refer to the Current Medication list given to you today.  *If you need a refill on your cardiac medications before your next appointment, please call your pharmacy*  Lab Work: none If you have labs (blood work) drawn today and your tests are completely normal, you will receive your results only by: MyChart Message (if you have MyChart) OR A paper copy in the mail If you have any lab test that is abnormal or we need to change your treatment, we will call you to review the results.  Testing/Procedures: Echo  Your physician has requested that you have an echocardiogram. Echocardiography is a painless test that uses sound waves to create images of your heart. It provides your doctor with information about the size and shape of your heart and how well your hearts chambers and valves are working. This procedure takes approximately one hour. There are no restrictions for this procedure. Please do NOT wear cologne, perfume, aftershave, or lotions (deodorant is allowed). Please arrive 15 minutes prior to your appointment  time.  Please note: We ask at that you not bring children with you during ultrasound (echo/ vascular) testing. Due to room size and safety concerns, children are not allowed in the ultrasound rooms during exams. Our front office staff cannot provide observation of children in our lobby area while testing is being conducted. An adult accompanying a patient to their appointment will only be allowed in the ultrasound room at the discretion of the ultrasound technician under special circumstances. We apologize for any inconvenience.   Follow-Up: At [facility], you and your health needs are our priority.  As part of our continuing mission to provide you with exceptional heart care, our providers are all part of one team.  This team includes your primary Cardiologist (physician) and Advanced Practice Providers or APPs (Physician Assistants and Nurse Practitioners) who all work together to provide you with the care you need, when you need it.  Your next appointment:   3 months  Provider:   Dr. Kate  We recommend signing up for the patient portal called MyChart.  Sign up information is provided on this After Visit Summary.  MyChart is used to connect with patients for Virtual Visits (Telemedicine).  Patients are able to view lab/test results, encounter notes, upcoming appointments, etc.  Non-urgent messages can be sent to your provider as well.   To learn more about what you can do with MyChart, go to [email or web address].   Other Instructions Zio  ZIO XT- Long Term Monitor Instructions  Your physician has requested you wear a ZIO patch monitor for 14 days.  This is a single patch monitor. [employer] supplies one patch monitor per enrollment. Additional stickers are not available. Please do not apply patch if you will be having a Nuclear Stress Test,  Echocardiogram, Cardiac CT, MRI, or Chest Xray during the period you would be wearing the  monitor. The patch cannot be worn during  these tests. You cannot remove and re-apply the  ZIO XT patch monitor.  Your ZIO patch monitor will be mailed 3 day USPS to your address on file. It may take 3-5 days  to receive your monitor after you have been enrolled.  Once you have received your monitor, please review the enclosed instructions. Your monitor  has already been registered assigning a specific monitor serial # to you.  Billing and Patient Assistance Program Information  We have supplied [employer] with any of your insurance information on file for billing purposes. [employer] offers a sliding scale Patient Assistance Program for patients that do not have  insurance, or whose insurance does not completely cover the cost of the ZIO monitor.  You must apply for the Patient Assistance Program to qualify for this discounted rate.  To apply, please call [employer] at [phone], select option 4, select option 2, ask to apply for  Patient Assistance Program. Meredeth will ask your household income, and how many people  are in your household. They will quote your out-of-pocket cost based on that information.  [employer] will also be able to set up a 23-month, interest-free payment plan if needed.  Applying the monitor   Shave hair from upper left chest.  Hold abrader disc by orange tab. Rub abrader in 40 strokes over the upper left chest as  indicated in your monitor instructions.  Clean area with 4 enclosed alcohol pads. Let dry.  Apply patch as indicated in monitor instructions. Patch will be placed under collarbone on left  side of chest with arrow pointing upward.  Rub patch adhesive wings for 2 minutes. Remove white label marked 1. Remove the white  label marked 2. Rub patch adhesive wings for 2 additional minutes.  While looking in a mirror, press and release button in center of patch. A small green light will  flash 3-4 times. This will be your only indicator that the monitor has been turned on.  Do not shower for the first 24  hours. You may shower after the first 24 hours.  Press the button if you feel a symptom. You will hear a small click. Record Date, Time and  Symptom in the Patient Logbook.  When you are ready to remove the patch, follow instructions on the last 2 pages of Patient  Logbook. Stick patch monitor onto the last page of Patient Logbook.  Place Patient Logbook in the blue and white box. Use locking tab on box and tape box closed  securely. The blue and white box has prepaid postage on it. Please place it in the mailbox as  soon as possible. Your physician should have your test results approximately 7 days after the  monitor has been mailed back to [facility].  Call [facility] Customer Care at [phone] if you have questions regarding  your ZIO XT patch monitor. Call them immediately if you see an orange light blinking on your  monitor.  If your monitor falls off in less than 4 days, contact our Monitor department at [phone].  If your monitor becomes loose or falls off after 4 days call [employer] at [phone] for  suggestions on securing your monitor    Please check your blood pressure twice a day for next 2 weeks and send those reading          Signed, Lonni LITTIE Nanas, MD  01/22/2024 5:19 PM    [facility]     [  1]  Current Meds  Medication Sig   acetaminophen  (TYLENOL ) 325 MG tablet Take 650 mg by mouth every 6 (six) hours as needed for fever.   [START ON 04/21/2024] amLODipine  (NORVASC ) 10 MG tablet Take 1 tablet (10 mg total) by mouth daily.   amLODipine  (NORVASC ) 10 MG tablet Take 1 tablet (10 mg total) by mouth daily.   docusate sodium  (COLACE) 100 MG capsule Take 1 capsule (100 mg total) by mouth 2 (two) times daily as needed for mild constipation.   gabapentin  (NEURONTIN ) 300 MG capsule Take 300 mg by mouth at bedtime.   omeprazole  (PRILOSEC) 40 MG capsule Take 1 capsule (40 mg total) by mouth 2 (two) times daily before a meal for 14  days.   tamsulosin  (FLOMAX ) 0.4 MG CAPS capsule Take 0.4 mg by mouth daily.   traZODone  (DESYREL ) 100 MG tablet Take 100 mg by mouth at bedtime as needed for sleep.  (Patient taking differently: Take 100 mg by mouth at bedtime.)   [START ON 04/21/2024] valsartan  (DIOVAN ) 320 MG tablet Take 1 tablet (320 mg total) by mouth daily.   valsartan  (DIOVAN ) 320 MG tablet Take 1 tablet (320 mg total) by mouth daily.   [DISCONTINUED] amLODipine  (NORVASC ) 10 MG tablet Take 1 tablet (10 mg total) by mouth daily.   [DISCONTINUED] lisinopril (ZESTRIL) 40 MG tablet Take 40 mg by mouth daily.    [DISCONTINUED] valsartan  (DIOVAN ) 320 MG tablet Take 1 tablet (320 mg total) by mouth daily.   "

## 2024-01-22 NOTE — Progress Notes (Unsigned)
 Enrolled for [employer] to mail a ZIO XT long term holter monitor to the patients address on file.

## 2024-01-22 NOTE — Patient Instructions (Signed)
 Medication Instructions:  Start taking Valsartan  320 mg daily Start Amlodipine  10mg  daily Stop Amlodipine - Valsartan  combination pill as directed by your provide Your physician recommends that you continue on your current medications as directed. Please refer to the Current Medication list given to you today.  *If you need a refill on your cardiac medications before your next appointment, please call your pharmacy*  Lab Work: none If you have labs (blood work) drawn today and your tests are completely normal, you will receive your results only by: MyChart Message (if you have MyChart) OR A paper copy in the mail If you have any lab test that is abnormal or we need to change your treatment, we will call you to review the results.  Testing/Procedures: Echo  Your physician has requested that you have an echocardiogram. Echocardiography is a painless test that uses sound waves to create images of your heart. It provides your doctor with information about the size and shape of your heart and how well your hearts chambers and valves are working. This procedure takes approximately one hour. There are no restrictions for this procedure. Please do NOT wear cologne, perfume, aftershave, or lotions (deodorant is allowed). Please arrive 15 minutes prior to your appointment time.  Please note: We ask at that you not bring children with you during ultrasound (echo/ vascular) testing. Due to room size and safety concerns, children are not allowed in the ultrasound rooms during exams. Our front office staff cannot provide observation of children in our lobby area while testing is being conducted. An adult accompanying a patient to their appointment will only be allowed in the ultrasound room at the discretion of the ultrasound technician under special circumstances. We apologize for any inconvenience.   Follow-Up: At [facility], you and your health needs are our priority.  As part of our  continuing mission to provide you with exceptional heart care, our providers are all part of one team.  This team includes your primary Cardiologist (physician) and Advanced Practice Providers or APPs (Physician Assistants and Nurse Practitioners) who all work together to provide you with the care you need, when you need it.  Your next appointment:   3 months  Provider:   Dr. Kate  We recommend signing up for the patient portal called MyChart.  Sign up information is provided on this After Visit Summary.  MyChart is used to connect with patients for Virtual Visits (Telemedicine).  Patients are able to view lab/test results, encounter notes, upcoming appointments, etc.  Non-urgent messages can be sent to your provider as well.   To learn more about what you can do with MyChart, go to [email or web address].   Other Instructions Zio  ZIO XT- Long Term Monitor Instructions  Your physician has requested you wear a ZIO patch monitor for 14 days.  This is a single patch monitor. [employer] supplies one patch monitor per enrollment. Additional stickers are not available. Please do not apply patch if you will be having a Nuclear Stress Test,  Echocardiogram, Cardiac CT, MRI, or Chest Xray during the period you would be wearing the  monitor. The patch cannot be worn during these tests. You cannot remove and re-apply the  ZIO XT patch monitor.  Your ZIO patch monitor will be mailed 3 day USPS to your address on file. It may take 3-5 days  to receive your monitor after you have been enrolled.  Once you have received your monitor, please review the enclosed instructions. Your monitor  has already  been registered assigning a specific monitor serial # to you.  Billing and Patient Assistance Program Information  We have supplied [employer] with any of your insurance information on file for billing purposes. [employer] offers a sliding scale Patient Assistance Program for patients that do not have   insurance, or whose insurance does not completely cover the cost of the ZIO monitor.  You must apply for the Patient Assistance Program to qualify for this discounted rate.  To apply, please call [employer] at [phone], select option 4, select option 2, ask to apply for  Patient Assistance Program. Meredeth will ask your household income, and how many people  are in your household. They will quote your out-of-pocket cost based on that information.  [employer] will also be able to set up a 65-month, interest-free payment plan if needed.  Applying the monitor   Shave hair from upper left chest.  Hold abrader disc by orange tab. Rub abrader in 40 strokes over the upper left chest as  indicated in your monitor instructions.  Clean area with 4 enclosed alcohol pads. Let dry.  Apply patch as indicated in monitor instructions. Patch will be placed under collarbone on left  side of chest with arrow pointing upward.  Rub patch adhesive wings for 2 minutes. Remove white label marked 1. Remove the white  label marked 2. Rub patch adhesive wings for 2 additional minutes.  While looking in a mirror, press and release button in center of patch. A small green light will  flash 3-4 times. This will be your only indicator that the monitor has been turned on.  Do not shower for the first 24 hours. You may shower after the first 24 hours.  Press the button if you feel a symptom. You will hear a small click. Record Date, Time and  Symptom in the Patient Logbook.  When you are ready to remove the patch, follow instructions on the last 2 pages of Patient  Logbook. Stick patch monitor onto the last page of Patient Logbook.  Place Patient Logbook in the blue and white box. Use locking tab on box and tape box closed  securely. The blue and white box has prepaid postage on it. Please place it in the mailbox as  soon as possible. Your physician should have your test results approximately 7 days after the  monitor  has been mailed back to [facility].  Call [facility] Customer Care at [phone] if you have questions regarding  your ZIO XT patch monitor. Call them immediately if you see an orange light blinking on your  monitor.  If your monitor falls off in less than 4 days, contact our Monitor department at [phone].  If your monitor becomes loose or falls off after 4 days call [employer] at [phone] for  suggestions on securing your monitor    Please check your blood pressure twice a day for next 2 weeks and send those reading

## 2024-02-24 ENCOUNTER — Ambulatory Visit (HOSPITAL_COMMUNITY)
